# Patient Record
Sex: Male | Born: 1975 | Race: White | Hispanic: No | Marital: Single | Smoking: Current every day smoker
Health system: Southern US, Community
[De-identification: ages and names within clinical notes are randomized; demographics above are authoritative.]

## PROBLEM LIST (undated history)

## (undated) DIAGNOSIS — F17200 Nicotine dependence, unspecified, uncomplicated: Secondary | ICD-10-CM

## (undated) DIAGNOSIS — F419 Anxiety disorder, unspecified: Secondary | ICD-10-CM

---

## 2009-10-11 HISTORY — PX: CORONARY ANGIOPLASTY WITH STENT PLACEMENT: SHX49

## 2010-10-25 ENCOUNTER — Emergency Department (HOSPITAL_BASED_OUTPATIENT_CLINIC_OR_DEPARTMENT_OTHER)
Admission: EM | Admit: 2010-10-25 | Discharge: 2010-10-25 | Payer: Self-pay | Source: Home / Self Care | Admitting: Emergency Medicine

## 2010-10-26 LAB — POCT CARDIAC MARKERS
CKMB, poc: 1.3 ng/mL (ref 1.0–8.0)
CKMB, poc: <1 ng/mL — ABNORMAL LOW (ref 1.0–8.0)
Myoglobin, poc: 65.7 ng/mL (ref 12–200)
Myoglobin, poc: 55.8 ng/mL (ref 12–200)
Troponin i, poc: <0.05 ng/mL (ref 0.00–0.09)
Troponin i, poc: <0.05 ng/mL (ref 0.00–0.09)

## 2010-10-26 LAB — DIFFERENTIAL
Basophils Absolute: 0 10*3/uL (ref 0.0–0.1)
Basophils Relative: 0 % (ref 0–1)
Eosinophils Absolute: 0.2 10*3/uL (ref 0.0–0.7)
Eosinophils Relative: 2 % (ref 0–5)
Lymphocytes Relative: 16 % (ref 12–46)
Lymphs Abs: 2 10*3/uL (ref 0.7–4.0)
Monocytes Absolute: 0.8 10*3/uL (ref 0.1–1.0)
Monocytes Relative: 6 % (ref 3–12)
Neutro Abs: 9.4 10*3/uL — ABNORMAL HIGH (ref 1.7–7.7)
Neutrophils Relative %: 76 % (ref 43–77)

## 2010-10-26 LAB — COMPREHENSIVE METABOLIC PANEL
ALT: 45 U/L (ref 0–53)
AST: 66 U/L — ABNORMAL HIGH (ref 0–37)
Albumin: 4.4 g/dL (ref 3.5–5.2)
Alkaline Phosphatase: 86 U/L (ref 39–117)
BUN: 19 mg/dL (ref 6–23)
CO2: 21 mEq/L (ref 19–32)
Calcium: 9.3 mg/dL (ref 8.4–10.5)
Chloride: 109 mEq/L (ref 96–112)
Creatinine, Ser: 1 mg/dL (ref 0.4–1.5)
GFR calc Af Amer: >60 mL/min (ref >60)
GFR calc non Af Amer: >60 mL/min (ref >60)
Glucose, Bld: 91 mg/dL (ref 70–99)
Potassium: 4.2 mEq/L (ref 3.5–5.1)
Sodium: 144 mEq/L (ref 135–145)
Total Bilirubin: 1 mg/dL (ref 0.3–1.2)
Total Protein: 7.7 g/dL (ref 6.0–8.3)

## 2010-10-26 LAB — CBC
HCT: 49.2 % (ref 39.0–52.0)
Hemoglobin: 17.1 g/dL — ABNORMAL HIGH (ref 13.0–17.0)
MCH: 29.5 pg (ref 26.0–34.0)
MCHC: 34.8 g/dL (ref 30.0–36.0)
MCV: 85 fL (ref 78.0–100.0)
Platelets: 277 10*3/uL (ref 150–400)
RBC: 5.79 MIL/uL (ref 4.22–5.81)
RDW: 12.5 % (ref 11.5–15.5)
WBC: 12.4 10*3/uL — ABNORMAL HIGH (ref 4.0–10.5)

## 2010-10-26 LAB — URINALYSIS, ROUTINE W REFLEX MICROSCOPIC
Bilirubin Urine: NEGATIVE
Hgb urine dipstick: NEGATIVE
Ketones, ur: NEGATIVE mg/dL
Nitrite: NEGATIVE
Protein, ur: NEGATIVE mg/dL
Specific Gravity, Urine: 1.03 (ref 1.005–1.030)
Urine Glucose, Fasting: NEGATIVE mg/dL
Urobilinogen, UA: 0.2 mg/dL (ref 0.0–1.0)
pH: 8 (ref 5.0–8.0)

## 2020-08-21 DIAGNOSIS — Z955 Presence of coronary angioplasty implant and graft: Secondary | ICD-10-CM

## 2020-08-21 HISTORY — DX: Presence of coronary angioplasty implant and graft: Z95.5

## 2020-08-21 HISTORY — PX: CORONARY ANGIOPLASTY WITH STENT PLACEMENT: SHX49

## 2020-08-27 ENCOUNTER — Encounter (HOSPITAL_COMMUNITY)
Admission: EM | Discharge status: Against Medical Advice | Payer: Self-pay | Source: Home / Self Care | Attending: Cardiovascular Disease

## 2020-08-27 ENCOUNTER — Emergency Department (HOSPITAL_COMMUNITY): Payer: Self-pay

## 2020-08-27 ENCOUNTER — Inpatient Hospital Stay (HOSPITAL_COMMUNITY)
Admission: EM | Admit: 2020-08-27 | Discharge: 2020-08-28 | DRG: 280 | Discharge status: Against Medical Advice | Payer: Self-pay | Attending: Interventional Cardiology | Admitting: Interventional Cardiology

## 2020-08-27 DIAGNOSIS — Z79899 Other long term (current) drug therapy: Secondary | ICD-10-CM

## 2020-08-27 DIAGNOSIS — I21A9 Other myocardial infarction type: Secondary | ICD-10-CM | POA: Diagnosis present

## 2020-08-27 DIAGNOSIS — I472 Ventricular tachycardia: Secondary | ICD-10-CM | POA: Diagnosis present

## 2020-08-27 DIAGNOSIS — Y831 Surgical operation with implant of artificial internal device as the cause of abnormal reaction of the patient, or of later complication, without mention of misadventure at the time of the procedure: Secondary | ICD-10-CM | POA: Diagnosis present

## 2020-08-27 DIAGNOSIS — I110 Hypertensive heart disease with heart failure: Secondary | ICD-10-CM | POA: Diagnosis present

## 2020-08-27 DIAGNOSIS — I213 ST elevation (STEMI) myocardial infarction of unspecified site: Secondary | ICD-10-CM

## 2020-08-27 DIAGNOSIS — I4901 Ventricular fibrillation: Secondary | ICD-10-CM | POA: Diagnosis present

## 2020-08-27 DIAGNOSIS — I2510 Atherosclerotic heart disease of native coronary artery without angina pectoris: Secondary | ICD-10-CM

## 2020-08-27 DIAGNOSIS — Z955 Presence of coronary angioplasty implant and graft: Secondary | ICD-10-CM

## 2020-08-27 DIAGNOSIS — Y712 Prosthetic and other implants, materials and accessory cardiovascular devices associated with adverse incidents: Secondary | ICD-10-CM | POA: Diagnosis present

## 2020-08-27 DIAGNOSIS — I97190 Other postprocedural cardiac functional disturbances following cardiac surgery: Secondary | ICD-10-CM | POA: Diagnosis present

## 2020-08-27 DIAGNOSIS — M7989 Other specified soft tissue disorders: Secondary | ICD-10-CM | POA: Diagnosis not present

## 2020-08-27 DIAGNOSIS — T82867A Thrombosis of cardiac prosthetic devices, implants and grafts, initial encounter: Principal | ICD-10-CM | POA: Diagnosis present

## 2020-08-27 DIAGNOSIS — I5023 Acute on chronic systolic (congestive) heart failure: Secondary | ICD-10-CM | POA: Diagnosis present

## 2020-08-27 DIAGNOSIS — Z20822 Contact with and (suspected) exposure to covid-19: Secondary | ICD-10-CM | POA: Diagnosis present

## 2020-08-27 DIAGNOSIS — I2102 ST elevation (STEMI) myocardial infarction involving left anterior descending coronary artery: Secondary | ICD-10-CM

## 2020-08-27 DIAGNOSIS — I9752 Accidental puncture and laceration of a circulatory system organ or structure during other procedure: Secondary | ICD-10-CM | POA: Diagnosis not present

## 2020-08-27 DIAGNOSIS — R079 Chest pain, unspecified: Secondary | ICD-10-CM

## 2020-08-27 DIAGNOSIS — Y838 Other surgical procedures as the cause of abnormal reaction of the patient, or of later complication, without mention of misadventure at the time of the procedure: Secondary | ICD-10-CM | POA: Diagnosis not present

## 2020-08-27 DIAGNOSIS — Z5329 Procedure and treatment not carried out because of patient's decision for other reasons: Secondary | ICD-10-CM | POA: Diagnosis present

## 2020-08-27 HISTORY — DX: Anxiety disorder, unspecified: F41.9

## 2020-08-27 HISTORY — PX: LEFT HEART CATH AND CORONARY ANGIOGRAPHY: CATH118249

## 2020-08-27 HISTORY — DX: Nicotine dependence, unspecified, uncomplicated: F17.200

## 2020-08-27 LAB — HEMOGLOBIN A1C
Hgb A1c MFr Bld: 5.5 % (ref 4.8–5.6)
Mean Plasma Glucose: 111.15 mg/dL

## 2020-08-27 LAB — COMPREHENSIVE METABOLIC PANEL
ALT: 147 U/L — ABNORMAL HIGH (ref 0–44)
AST: 174 U/L — ABNORMAL HIGH (ref 15–41)
Albumin: 3.5 g/dL (ref 3.5–5.0)
Alkaline Phosphatase: 59 U/L (ref 38–126)
Anion gap: 11 (ref 5–15)
BUN: 37 mg/dL — ABNORMAL HIGH (ref 6–20)
CO2: 23 mmol/L (ref 22–32)
Calcium: 9.5 mg/dL (ref 8.9–10.3)
Chloride: 102 mmol/L (ref 98–111)
Creatinine, Ser: 1.03 mg/dL (ref 0.61–1.24)
GFR, Estimated: >60 mL/min (ref >60)
Glucose, Bld: 98 mg/dL (ref 70–99)
Potassium: 4 mmol/L (ref 3.5–5.1)
Sodium: 136 mmol/L (ref 135–145)
Total Bilirubin: 0.8 mg/dL (ref 0.3–1.2)
Total Protein: 6.6 g/dL (ref 6.5–8.1)

## 2020-08-27 LAB — CBC WITH DIFFERENTIAL/PLATELET
Abs Immature Granulocytes: 0.06 10*3/uL (ref 0.00–0.07)
Basophils Absolute: 0 10*3/uL (ref 0.0–0.1)
Basophils Relative: 0 %
Eosinophils Absolute: 0.3 10*3/uL (ref 0.0–0.5)
Eosinophils Relative: 2 %
HCT: 45.9 % (ref 39.0–52.0)
Hemoglobin: 15.6 g/dL (ref 13.0–17.0)
Immature Granulocytes: 1 %
Lymphocytes Relative: 26 %
Lymphs Abs: 3.3 10*3/uL (ref 0.7–4.0)
MCH: 30.9 pg (ref 26.0–34.0)
MCHC: 34 g/dL (ref 30.0–36.0)
MCV: 90.9 fL (ref 80.0–100.0)
Monocytes Absolute: 1.1 10*3/uL — ABNORMAL HIGH (ref 0.1–1.0)
Monocytes Relative: 8 %
Neutro Abs: 8.1 10*3/uL — ABNORMAL HIGH (ref 1.7–7.7)
Neutrophils Relative %: 63 %
Platelets: 375 10*3/uL (ref 150–400)
RBC: 5.05 MIL/uL (ref 4.22–5.81)
RDW: 13.2 % (ref 11.5–15.5)
WBC: 12.8 10*3/uL — ABNORMAL HIGH (ref 4.0–10.5)
nRBC: 0 % (ref 0.0–0.2)

## 2020-08-27 LAB — APTT: aPTT: 29 seconds (ref 24–36)

## 2020-08-27 LAB — PROTIME-INR
INR: 1 (ref 0.8–1.2)
Prothrombin Time: 12.3 seconds (ref 11.4–15.2)

## 2020-08-27 LAB — LIPID PANEL
Cholesterol: 188 mg/dL (ref 0–200)
HDL: 44 mg/dL (ref >40)
LDL Cholesterol: 111 mg/dL — ABNORMAL HIGH (ref 0–99)
Total CHOL/HDL Ratio: 4.3 RATIO
Triglycerides: 164 mg/dL — ABNORMAL HIGH (ref <150)
VLDL: 33 mg/dL (ref 0–40)

## 2020-08-27 LAB — TROPONIN I (HIGH SENSITIVITY): Troponin I (High Sensitivity): 845 ng/L (ref <18)

## 2020-08-27 LAB — RESPIRATORY PANEL BY RT PCR (FLU A&B, COVID)
Influenza A by PCR: NEGATIVE
Influenza B by PCR: NEGATIVE
SARS Coronavirus 2 by RT PCR: NEGATIVE

## 2020-08-27 SURGERY — LEFT HEART CATH AND CORONARY ANGIOGRAPHY
Anesthesia: LOCAL

## 2020-08-27 MED ORDER — TICAGRELOR 90 MG PO TABS
90.0000 mg | ORAL_TABLET | Freq: Two times a day (BID) | ORAL | Status: DC
  Administered 2020-08-28: 90 mg via ORAL
  Filled 2020-08-27: qty 1

## 2020-08-27 MED ORDER — SODIUM CHLORIDE 0.9% FLUSH
3.0000 mL | Freq: Two times a day (BID) | INTRAVENOUS | Status: DC
  Administered 2020-08-28: 3 mL via INTRAVENOUS

## 2020-08-27 MED ORDER — IOHEXOL 350 MG/ML SOLN
INTRAVENOUS | Status: AC
  Filled 2020-08-27: qty 1

## 2020-08-27 MED ORDER — ONDANSETRON HCL 4 MG/2ML IJ SOLN: 4.0000 mg | Freq: Four times a day (QID) | INTRAMUSCULAR | Status: DC | PRN

## 2020-08-27 MED ORDER — HEPARIN (PORCINE) 25000 UT/250ML-% IV SOLN: 1000.0000 [IU]/h | INTRAVENOUS | Status: DC

## 2020-08-27 MED ORDER — IOHEXOL 350 MG/ML SOLN
INTRAVENOUS | Status: DC | PRN
  Administered 2020-08-27: 70 mL via INTRA_ARTERIAL

## 2020-08-27 MED ORDER — VERAPAMIL HCL 2.5 MG/ML IV SOLN
INTRAVENOUS | Status: AC
  Filled 2020-08-27: qty 2

## 2020-08-27 MED ORDER — ACETAMINOPHEN 325 MG PO TABS: 650.0000 mg | ORAL_TABLET | ORAL | Status: DC | PRN

## 2020-08-27 MED ORDER — MIDAZOLAM HCL 2 MG/2ML IJ SOLN
INTRAMUSCULAR | Status: AC
  Filled 2020-08-27: qty 2

## 2020-08-27 MED ORDER — HYDRALAZINE HCL 20 MG/ML IJ SOLN: 10.0000 mg | INTRAMUSCULAR | Status: AC | PRN

## 2020-08-27 MED ORDER — SODIUM CHLORIDE 0.9 % IV SOLN: INTRAVENOUS | Status: DC

## 2020-08-27 MED ORDER — TIROFIBAN HCL IN NACL 5-0.9 MG/100ML-% IV SOLN
INTRAVENOUS | Status: AC | PRN
  Administered 2020-08-27: 0.15 ug/kg/min via INTRAVENOUS

## 2020-08-27 MED ORDER — HEPARIN SODIUM (PORCINE) 5000 UNIT/ML IJ SOLN
4000.0000 [IU] | Freq: Once | INTRAMUSCULAR | Status: AC
  Administered 2020-08-27: 4000 [IU] via INTRAVENOUS

## 2020-08-27 MED ORDER — MIDAZOLAM HCL 2 MG/2ML IJ SOLN
INTRAMUSCULAR | Status: DC | PRN
  Administered 2020-08-27: 2 mg via INTRAVENOUS

## 2020-08-27 MED ORDER — NITROGLYCERIN IN D5W 200-5 MCG/ML-% IV SOLN
0.0000 ug/min | INTRAVENOUS | Status: DC
  Administered 2020-08-27: 5 ug/min via INTRAVENOUS

## 2020-08-27 MED ORDER — LIDOCAINE HCL (PF) 1 % IJ SOLN
INTRAMUSCULAR | Status: AC
  Filled 2020-08-27: qty 30

## 2020-08-27 MED ORDER — TICAGRELOR 90 MG PO TABS
ORAL_TABLET | ORAL | Status: DC | PRN
  Administered 2020-08-27: 180 mg via ORAL

## 2020-08-27 MED ORDER — DIAZEPAM 5 MG PO TABS: 5.0000 mg | ORAL_TABLET | Freq: Four times a day (QID) | ORAL | Status: DC | PRN

## 2020-08-27 MED ORDER — TIROFIBAN (AGGRASTAT) BOLUS VIA INFUSION
INTRAVENOUS | Status: DC | PRN
  Administered 2020-08-27: 2155 ug via INTRAVENOUS

## 2020-08-27 MED ORDER — TICAGRELOR 90 MG PO TABS
ORAL_TABLET | ORAL | Status: AC
  Filled 2020-08-27: qty 2

## 2020-08-27 MED ORDER — FENTANYL CITRATE (PF) 100 MCG/2ML IJ SOLN
INTRAMUSCULAR | Status: DC | PRN
  Administered 2020-08-27: 25 ug via INTRAVENOUS

## 2020-08-27 MED ORDER — SODIUM CHLORIDE 0.9 % IV SOLN: 250.0000 mL | INTRAVENOUS | Status: DC | PRN

## 2020-08-27 MED ORDER — VERAPAMIL HCL 2.5 MG/ML IV SOLN
INTRAVENOUS | Status: DC | PRN
  Administered 2020-08-27: 10 mL via INTRA_ARTERIAL

## 2020-08-27 MED ORDER — ASPIRIN 81 MG PO CHEW
81.0000 mg | CHEWABLE_TABLET | Freq: Every day | ORAL | Status: DC
  Administered 2020-08-28: 81 mg via ORAL
  Filled 2020-08-27: qty 1

## 2020-08-27 MED ORDER — ATORVASTATIN CALCIUM 80 MG PO TABS
80.0000 mg | ORAL_TABLET | Freq: Every day | ORAL | Status: DC
  Administered 2020-08-28: 80 mg via ORAL
  Filled 2020-08-27: qty 1

## 2020-08-27 MED ORDER — LIDOCAINE HCL (PF) 1 % IJ SOLN
INTRAMUSCULAR | Status: DC | PRN
  Administered 2020-08-27: 5 mL via SUBCUTANEOUS

## 2020-08-27 MED ORDER — FENTANYL CITRATE (PF) 100 MCG/2ML IJ SOLN
INTRAMUSCULAR | Status: AC
  Filled 2020-08-27: qty 2

## 2020-08-27 MED ORDER — NITROGLYCERIN IN D5W 200-5 MCG/ML-% IV SOLN
0.0000 ug/min | INTRAVENOUS | Status: DC
  Administered 2020-08-27: 15 ug/min via INTRAVENOUS

## 2020-08-27 MED ORDER — TIROFIBAN HCL IN NACL 5-0.9 MG/100ML-% IV SOLN
INTRAVENOUS | Status: AC
  Filled 2020-08-27: qty 100

## 2020-08-27 MED ORDER — HEPARIN (PORCINE) IN NACL 1000-0.9 UT/500ML-% IV SOLN
INTRAVENOUS | Status: DC | PRN
  Administered 2020-08-27 (×2): 500 mL

## 2020-08-27 MED ORDER — HEPARIN (PORCINE) IN NACL 1000-0.9 UT/500ML-% IV SOLN
INTRAVENOUS | Status: AC
  Filled 2020-08-27: qty 1000

## 2020-08-27 MED ORDER — HEPARIN SODIUM (PORCINE) 1000 UNIT/ML IJ SOLN
INTRAMUSCULAR | Status: DC | PRN
  Administered 2020-08-27: 3000 [IU] via INTRAVENOUS
  Administered 2020-08-27: 4000 [IU] via INTRAVENOUS

## 2020-08-27 MED ORDER — HEPARIN SODIUM (PORCINE) 1000 UNIT/ML IJ SOLN
INTRAMUSCULAR | Status: AC
  Filled 2020-08-27: qty 1

## 2020-08-27 MED ORDER — SODIUM CHLORIDE 0.9 % IV SOLN: INTRAVENOUS | Status: AC

## 2020-08-27 MED ORDER — SODIUM CHLORIDE 0.9% FLUSH: 3.0000 mL | INTRAVENOUS | Status: DC | PRN

## 2020-08-27 MED ORDER — LABETALOL HCL 5 MG/ML IV SOLN: 10.0000 mg | INTRAVENOUS | Status: AC | PRN

## 2020-08-27 MED ORDER — NITROGLYCERIN 1 MG/10 ML FOR IR/CATH LAB
INTRA_ARTERIAL | Status: AC
  Filled 2020-08-27: qty 10

## 2020-08-27 MED ORDER — TIROFIBAN HCL IN NACL 5-0.9 MG/100ML-% IV SOLN
0.0750 ug/kg/min | INTRAVENOUS | Status: DC
  Administered 2020-08-27: 0.15 ug/kg/min via INTRAVENOUS

## 2020-08-27 SURGICAL SUPPLY — 12 items
CATH INFINITI JR4 5F (CATHETERS) ×2 IMPLANT
CATH VISTA GUIDE 6FR XBLAD3.5 (CATHETERS) ×2 IMPLANT
DEVICE RAD COMP TR BAND LRG (VASCULAR PRODUCTS) ×2 IMPLANT
GLIDESHEATH SLEND SS 6F .021 (SHEATH) ×2 IMPLANT
GUIDEWIRE INQWIRE 1.5J.035X260 (WIRE) ×1 IMPLANT
INQWIRE 1.5J .035X260CM (WIRE) ×2
KIT ENCORE 26 ADVANTAGE (KITS) ×2 IMPLANT
KIT HEART LEFT (KITS) ×2 IMPLANT
PACK CARDIAC CATHETERIZATION (CUSTOM PROCEDURE TRAY) ×2 IMPLANT
SYR MEDRAD MARK 7 150ML (SYRINGE) ×2 IMPLANT
TRANSDUCER W/STOPCOCK (MISCELLANEOUS) ×2 IMPLANT
TUBING CIL FLEX 10 FLL-RA (TUBING) ×2 IMPLANT

## 2020-08-27 NOTE — ED Provider Notes (Signed)
[facility] EMERGENCY DEPARTMENT Provider Note   CSN: [id number] Arrival date & time: 08/27/20  2115     History Chief Complaint  Patient presents with  . Chest Pain    Francisco Stewart is a 44 y.o. male.  The history is provided by the patient, medical records and the EMS personnel.   Francisco Stewart is a 44 y.o. male who presents to the Emergency Department complaining of chest pain.  He presents to the ED by EMS complaining of chest pain that started about two hours prior to ED arrival.  He has a hx/o HTN, CAD s/o stenting at [facility] last week.  He was unable to fill his brillinta at the time of hospital discharge.  He has experienced mild intermittent chest pain since hospital discharge.  Today the pain significantly worsened and EMS was called.  EMS activated a STEMI prior to ED arrival.  He took 325 aspirin at home.  He received two SL NTG by EMS with improvement in his pain to 8/10 from 10/10.  Pain is located in central chest and radiates to his right jaw and bilateral shoulders.  Denies additional sxs.  He is a smoker.  No alcohol or drug use.      No past medical history on file. CAD Patient Active Problem List   Diagnosis Date Noted  . STEMI involving left anterior descending coronary artery (HCC) 08/27/2020  . ST elevation myocardial infarction involving left anterior descending (LAD) coronary artery (HCC)          No family history on file.  Social History   Tobacco Use  . Smoking status: Not on file  Substance Use Topics  . Alcohol use: Not on file  . Drug use: Not on file    Home Medications Prior to Admission medications   Not on File    Allergies    Patient has no known allergies.  Review of Systems   Review of Systems  All other systems reviewed and are negative.   Physical Exam Updated Vital Signs BP 110/77   Pulse (!) 55   Temp 98 F (36.7 C) (Oral)   Resp 17   Ht 6\' 2"  (1.88 m)   Wt 86.2 kg   SpO2 99%   BMI 24.39  kg/m   Physical Exam Vitals and nursing note reviewed.  Constitutional:      General: He is in acute distress.     Appearance: He is well-developed. He is ill-appearing.  HENT:     Head: Normocephalic and atraumatic.  Cardiovascular:     Rate and Rhythm: Normal rate and regular rhythm.     Heart sounds: No murmur heard.   Pulmonary:     Effort: Pulmonary effort is normal. No respiratory distress.     Breath sounds: Normal breath sounds.  Abdominal:     Palpations: Abdomen is soft.     Tenderness: There is no abdominal tenderness. There is no guarding or rebound.  Musculoskeletal:        General: No swelling or tenderness.  Skin:    General: Skin is warm and dry.  Neurological:     Mental Status: He is alert and oriented to person, place, and time.  Psychiatric:        Behavior: Behavior normal.     ED Results / Procedures / Treatments   Labs (all labs ordered are listed, but only abnormal results are displayed) Labs Reviewed  CBC WITH DIFFERENTIAL/PLATELET - Abnormal; Notable for the following  components:      Result Value   WBC 12.8 (*)    Neutro Abs 8.1 (*)    Monocytes Absolute 1.1 (*)    All other components within normal limits  COMPREHENSIVE METABOLIC PANEL - Abnormal; Notable for the following components:   BUN 37 (*)    AST 174 (*)    ALT 147 (*)    All other components within normal limits  LIPID PANEL - Abnormal; Notable for the following components:   Triglycerides 164 (*)    LDL Cholesterol 111 (*)    All other components within normal limits  TROPONIN I (HIGH SENSITIVITY) - Abnormal; Notable for the following components:   Troponin I (High Sensitivity) 845 (*)    All other components within normal limits  RESPIRATORY PANEL BY RT PCR (FLU A&B, COVID)  MRSA PCR SCREENING  HEMOGLOBIN A1C  PROTIME-INR  APTT  CBC  BASIC METABOLIC PANEL  HEPARIN LEVEL (UNFRACTIONATED)  TROPONIN I (HIGH SENSITIVITY)    EKG None  Radiology CARDIAC  CATHETERIZATION  Result Date: 08/27/2020  Prox LAD to Mid LAD lesion is 25% stenosed.  1st Diag lesion is 50% stenosed.  Patent LAD stent with mild residual thrombus in the distal portion of the stent with brisk TIMI 3 flow.  The initial ECG by EMS showed acute anterior ST elevation suggesting initial total occlusion secondary to thrombus which reperfused in the ER with heparin and NTG resulting in evolutionary T wave inversion prior to coming to the cath lab. The patient had not taken brilinta since his procedure on 08/21/20 resulting in acute stent thrombosis. Normal ramus, left circumflex and large dominant RCA. LVEDP 10 mm Hg. RECOMMENDATION: Continue aggrastat infusion until re-look cath on 08/29/20. Will resume heparin 8 hours post procedure. DAPT with ASA/brilint for at least 12 months. Aggressive lipid lowering with target LDL <70. Once IV NTG is dc'd initiate oral therapy with diagonal disease.  2D-echo in am. Consider post MI ACE-I/ARB and beta blocker as BP and HR allow.   DG Chest Port 1 View  Result Date: 08/27/2020 CLINICAL DATA:  Chest pain EXAM: PORTABLE CHEST 1 VIEW COMPARISON:  None.  August 21, 2020 FINDINGS: The heart size and mediastinal contours are within normal limits. There is a small left and trace right pleural effusion present. The visualized skeletal structures are unremarkable. IMPRESSION: Small left and trace right pleural effusion present. Electronically Signed   By: Jonna Clark M.D.   On: 08/27/2020 21:36    Procedures Procedures (including critical care time) CRITICAL CARE Performed by: Tilden Fossa   Total critical care time: 35 minutes  Critical care time was exclusive of separately billable procedures and treating other patients.  Critical care was necessary to treat or prevent imminent or life-threatening deterioration.  Critical care was time spent personally by me on the following activities: development of treatment plan with patient and/or  surrogate as well as nursing, discussions with consultants, evaluation of patient's response to treatment, examination of patient, obtaining history from patient or surrogate, ordering and performing treatments and interventions, ordering and review of laboratory studies, ordering and review of radiographic studies, pulse oximetry and re-evaluation of patient's condition.  Medications Ordered in ED Medications  0.9 %  sodium chloride infusion ( Intravenous New Bag/Given 08/27/20 2229)  labetalol (NORMODYNE) injection 10 mg (has no administration in time range)  hydrALAZINE (APRESOLINE) injection 10 mg (has no administration in time range)  acetaminophen (TYLENOL) tablet 650 mg (has no administration in time range)  ondansetron (  ZOFRAN) injection 4 mg (has no administration in time range)  sodium chloride flush (NS) 0.9 % injection 3 mL (has no administration in time range)  sodium chloride flush (NS) 0.9 % injection 3 mL (has no administration in time range)  0.9 %  sodium chloride infusion (has no administration in time range)  diazepam (VALIUM) tablet 5 mg (has no administration in time range)  atorvastatin (LIPITOR) tablet 80 mg (has no administration in time range)  aspirin chewable tablet 81 mg (has no administration in time range)  ticagrelor (BRILINTA) tablet 90 mg (has no administration in time range)  nitroGLYCERIN 50 mg in dextrose 5 % 250 mL (0.2 mg/mL) infusion (15 mcg/min Intravenous Rate/Dose Verify 08/28/20 0000)  0.9 %  sodium chloride infusion ( Intravenous Rate/Dose Verify 08/28/20 0000)    Followed by  0.9 %  sodium chloride infusion (has no administration in time range)  heparin ADULT infusion 100 units/mL (25000 units/284mL sodium chloride 0.45%) (has no administration in time range)  tirofiban (AGGRASTAT) infusion 50 mcg/mL 100 mL (0.15 mcg/kg/min  86.2 kg Intravenous Rate/Dose Verify 08/28/20 0000)  heparin injection 4,000 Units (4,000 Units Intravenous Given 08/27/20  2118)  tirofiban (AGGRASTAT) infusion 50 mcg/mL 100 mL (0.15 mcg/kg/min  86.2 kg Intravenous New Bag/Given 08/27/20 2228)    ED Course  I have reviewed the triage vital signs and the nursing notes.  Pertinent labs & imaging results that were available during my care of the patient were reviewed by me and considered in my medical decision making (see chart for details).    MDM Rules/Calculators/A&P                         Pt here as a STEMI by EMS.  Pt with recent STEMI at [facility] s/p stent placement.  He has been unable to take his Brillinta since leaving the hospital.  He was treated with heparin bolus and started on a nitroglycerin drip.  He was evaluated by Cardiology in the ED with plan to take directly to the cath lab for intervention.    Final Clinical Impression(s) / ED Diagnoses Final diagnoses:  ST elevation myocardial infarction (STEMI), unspecified artery [facility])    Rx / DC Orders ED Discharge Orders    None       Tilden Fossa, MD 08/28/20 0025

## 2020-08-27 NOTE — Progress Notes (Signed)
Chaplain responded to page to [facility]. Patient brought into Resus room and now heading for Cath lab.  Patient reportedly contacted his spouse Raynelle Fanning in route to the hospital.  Lunette Stands will be available for support if spouse comes. Will refer to unit chaplain in the AM. Rev. Lynnell Chad   Pager [phone]

## 2020-08-27 NOTE — ED Notes (Signed)
IV push heparin 4000 units per pharmacy by andrew RN.

## 2020-08-27 NOTE — ED Triage Notes (Signed)
Pt arrived via [facility] from home after chest pain with left arm pain. Pt had stent last week at [facility].

## 2020-08-27 NOTE — H&P (Signed)
Cardiology Admission History and Physical:   Patient ID: Francisco Stewart MRN: [id number]; DOB: [birth year]   Admission date: 08/27/2020  Primary Care Provider: No primary care provider on file. [facility] Cardiologist: No primary care provider on file.  [facility] Electrophysiologist:  None   Chief Complaint:  STEMI  Patient Profile:   Francisco Stewart is a 44 y.o. male with h/o CAD, s/p anterior STEMI last week on 08-21-20, now presenting after not taking DAPT since PCI w/ recurrent sx and EKG changes suggestive of possible acute stent thrombosis  History of Present Illness:   Mr. Francisco Stewart was admitted at [facility] last week after presenting w/ anterior STEMI. Notes from [facility] indicate that pt had h/o CAD w/ h/o PCI to LAD in the remote past. Before pt could be taken to the cath lab, he had VF arrest x 3 requiring multiple DCCV. He was intubated, taken to cath lab, and underwent PCI to LAD.  On-call cardiologist was notified later in the evening after the initial PCI that pt had recurrent CP w/ residual ST elevation; he was taken back to the cath lab and no thrombosis or residual significant lesion was noted. Documentation from this point is sparse; there is no discharge summary but pt states the he "just decided to leave" and had to be escorted out of the hospital w/ security, making it sound like he may have left AMA. He did not have his brilinta or any prescriptions and therefore has not been on DAPT since his initial PCI on 08-21-20.  He has been having intermittent chest discomfort since that time but it became acutely more severe, like his STEMI, earlier this evening so he called EMS. He was found to have recurrent ST elevations in the anterior leads.   No past medical history on file.  CAD w/ h/o PCI to LAD last week at [facility]     Medications Prior to Admission: Prior to Admission medications   Not on File   pt has not been taking any home meds  Allergies:   No Known  Allergies  Social History:  +current smoker   Family History:   The patient's family history is not on file.   FHx non-contributory  ROS:  Please see the history of present illness.  All other ROS reviewed and negative.     Physical Exam/Data:   Vitals:   08/27/20 2125 08/27/20 2130 08/27/20 2135 08/27/20 2150  BP: 130/85 (!) 129/91 125/75   Pulse: 68 63 63   Resp: 15 17 14    Temp:      TempSrc:      SpO2: 100% 99% 99% 97%  Weight:      Height:       No intake or output data in the 24 hours ending 08/27/20 2158 Last 3 Weights 08/27/2020  Weight (lbs) 190 lb  Weight (kg) 86.183 kg     Body mass index is 24.39 kg/m.  General:  Well nourished, well developed, in no acute distress HEENT: normal Lymph: no adenopathy Neck: no JVD Endocrine:  No thryomegaly Vascular: No carotid bruits; DP pulses 2+ bilaterally   Cardiac:  normal S1, S2; RRR; no murmur  Lungs:  clear to auscultation bilaterally, no wheezing, rhonchi or rales  Abd: soft, nontender, no hepatomegaly  Ext: no edema Musculoskeletal:  No deformities, BUE and BLE strength normal and equal Skin: warm and dry  Neuro:  CNs 2-12 intact, no focal abnormalities noted Psych:  Normal affect    EKG:  The ECG that was done 08-27-20 was personally reviewed and demonstrates NSR with evolving anterior STEMI  Relevant CV Studies: 08-21-20 initial LHC/RHC PLAD thrombotic occlusion; TIMI =0, instent LM, circ, RCA wnl  PCI: DES LAD 3.5x20 synergy, posted 3.5@18atm .  IVUS: Good stent apposition, moderate distal Stent edge plaque, no flap.   RHC: normal pressures. LVEDP and PCWP 16-18 range. TD CO 3.2  Echo in lab: EF 25-30%   Stat TTE in the cath lab 08-21-20 SUMMARY  Limited stat echo in the cath lab.  There is no pericardial effusion.  LV ejection fraction = 30-35%.   Laboratory Data:  High Sensitivity Troponin:  No results for input(s): TROPONINIHS in the last 720 hours.    ChemistryNo results for input(s):  NA, K, CL, CO2, GLUCOSE, BUN, CREATININE, CALCIUM, GFRNONAA, GFRAA, ANIONGAP in the last 168 hours.  No results for input(s): PROT, ALBUMIN, AST, ALT, ALKPHOS, BILITOT in the last 168 hours. HematologyNo results for input(s): WBC, RBC, HGB, HCT, MCV, MCH, MCHC, RDW, PLT in the last 168 hours. BNPNo results for input(s): BNP, PROBNP in the last 168 hours.  DDimer No results for input(s): DDIMER in the last 168 hours.   Radiology/Studies:  DG Chest Port 1 View  Result Date: 08/27/2020 CLINICAL DATA:  Chest pain EXAM: PORTABLE CHEST 1 VIEW COMPARISON:  None.  August 21, 2020 FINDINGS: The heart size and mediastinal contours are within normal limits. There is a small left and trace right pleural effusion present. The visualized skeletal structures are unremarkable. IMPRESSION: Small left and trace right pleural effusion present. Electronically Signed   By: Jonna Clark M.D.   On: 08/27/2020 21:36     Assessment and Plan:   1. CP/STEMI: he has been off DAPT since initial PCI to LAD last week; concern for acute stent thrombosis. Plan for emergent LHC for further evaluation Will start statin, BB, and risk stratify w/ TSH, HgbA1c, and FLP. Initiate DAPT.      TIMI Risk Score for ST  Elevation MI:   The patient's TIMI risk score is 3, which indicates a 4.4% risk of all cause mortality at 30 days.     For questions or updates, please contact [facility] Please consult [email or web address] for contact info under     Signed, Precious Reel, MD, [facility] 08/27/2020 9:58 PM

## 2020-08-27 NOTE — Progress Notes (Signed)
ANTICOAGULATION CONSULT NOTE - Initial Consult  Pharmacy Consult for heparin  Indication: chest pain/ACS  No Known Allergies  Patient Measurements: Height: 6\' 2"  (188 cm) Weight: 86.2 kg (190 lb) IBW/kg (Calculated) : 82.2 HEPARIN DW (KG): 86.2   Vital Signs: Temp: 97.3 F (36.3 C) (11/17 2117) Temp Source: Oral (11/17 2117) BP: 125/75 (11/17 2135) Pulse Rate: 63 (11/17 2135)  Labs: Recent Labs    08/27/20 2118  HGB 15.6  HCT 45.9  PLT 375  APTT 29  LABPROT 12.3  INR 1.0  CREATININE 1.03  TROPONINIHS 845*    Estimated Creatinine Clearance: 104.2 mL/min (by C-G formula based on SCr of 1.03 mg/dL).   Medical History: No past medical history on file.   Assessment: 44 yo male here with CODE STEMI and s/p cath.  He had a STEMI 08/21/20 with PCI but has not been taking any medications.  Pharmacy to dose heparin (start 8 hours post sheath). He will also continue tirofiban until his PCI -Hg= 15.6  Goal of Therapy:  Heparin level 0.3-0.5 units/ml Monitor platelets by anticoagulation protocol: Yes   Plan:  -Start heparin at 1000 units/hr 8 hours after sheath removal -Heparin level in 6 hours and daily wth CBC daily -Continue tirofiban at 0.28mcg/kg/min -Heparin level and CBC in 8 hours and daily wth CBC daily  12m, PharmD Clinical Pharmacist **Pharmacist phone directory can now be found on [email or web address] ([email or web address]).  Listed under [facility].

## 2020-08-27 NOTE — ED Notes (Signed)
Transported to cath lab 

## 2020-08-28 ENCOUNTER — Inpatient Hospital Stay (HOSPITAL_COMMUNITY): Payer: Self-pay

## 2020-08-28 ENCOUNTER — Encounter (HOSPITAL_COMMUNITY): Payer: Self-pay | Admitting: Cardiovascular Disease

## 2020-08-28 ENCOUNTER — Other Ambulatory Visit: Payer: Self-pay

## 2020-08-28 DIAGNOSIS — T82867A Thrombosis of cardiac prosthetic devices, implants and grafts, initial encounter: Principal | ICD-10-CM

## 2020-08-28 DIAGNOSIS — I2102 ST elevation (STEMI) myocardial infarction involving left anterior descending coronary artery: Secondary | ICD-10-CM

## 2020-08-28 DIAGNOSIS — I5023 Acute on chronic systolic (congestive) heart failure: Secondary | ICD-10-CM

## 2020-08-28 LAB — ECHOCARDIOGRAM COMPLETE
Area-P 1/2: 3.48 cm2
Height: 74 in
S' Lateral: 3.8 cm
Weight: 3051.17 oz

## 2020-08-28 LAB — POCT I-STAT, CHEM 8
BUN: 37 mg/dL — ABNORMAL HIGH (ref 6–20)
Calcium, Ion: 1.29 mmol/L (ref 1.15–1.40)
Chloride: 100 mmol/L (ref 98–111)
Creatinine, Ser: 0.8 mg/dL (ref 0.61–1.24)
Glucose, Bld: 103 mg/dL — ABNORMAL HIGH (ref 70–99)
HCT: 40 % (ref 39.0–52.0)
Hemoglobin: 13.6 g/dL (ref 13.0–17.0)
Potassium: 3.4 mmol/L — ABNORMAL LOW (ref 3.5–5.1)
Sodium: 138 mmol/L (ref 135–145)
TCO2: 26 mmol/L (ref 22–32)

## 2020-08-28 LAB — CBC
HCT: 41.9 % (ref 39.0–52.0)
Hemoglobin: 14.6 g/dL (ref 13.0–17.0)
MCH: 31.1 pg (ref 26.0–34.0)
MCHC: 34.8 g/dL (ref 30.0–36.0)
MCV: 89.1 fL (ref 80.0–100.0)
Platelets: 349 10*3/uL (ref 150–400)
RBC: 4.7 MIL/uL (ref 4.22–5.81)
RDW: 13.1 % (ref 11.5–15.5)
WBC: 10.3 10*3/uL (ref 4.0–10.5)
nRBC: 0 % (ref 0.0–0.2)

## 2020-08-28 LAB — BASIC METABOLIC PANEL
Anion gap: 10 (ref 5–15)
BUN: 31 mg/dL — ABNORMAL HIGH (ref 6–20)
CO2: 21 mmol/L — ABNORMAL LOW (ref 22–32)
Calcium: 8.8 mg/dL — ABNORMAL LOW (ref 8.9–10.3)
Chloride: 106 mmol/L (ref 98–111)
Creatinine, Ser: 0.91 mg/dL (ref 0.61–1.24)
GFR, Estimated: >60 mL/min (ref >60)
Glucose, Bld: 92 mg/dL (ref 70–99)
Potassium: 3.9 mmol/L (ref 3.5–5.1)
Sodium: 137 mmol/L (ref 135–145)

## 2020-08-28 LAB — MRSA PCR SCREENING: MRSA by PCR: NEGATIVE

## 2020-08-28 LAB — POCT ACTIVATED CLOTTING TIME: Activated Clotting Time: 180 seconds

## 2020-08-28 LAB — TROPONIN I (HIGH SENSITIVITY): Troponin I (High Sensitivity): 616 ng/L (ref <18)

## 2020-08-28 MED ORDER — CHLORHEXIDINE GLUCONATE CLOTH 2 % EX PADS: 6.0000 | MEDICATED_PAD | Freq: Every day | CUTANEOUS | Status: DC

## 2020-08-28 NOTE — Progress Notes (Signed)
Walked into pt room due to telemetry alarming and IV pumps alarming. Upon entering the room the patient and his visitor were gone. The patient had unhooked himself from his IV medications as well as his telemetry, and took off his gown and left everything in the bed. Attending MD, Charge RN, and security notified of patient leaving room with visitor and unable to locate.

## 2020-08-28 NOTE — Progress Notes (Signed)
Progress Note  Patient Name: Francisco Stewart Date of Encounter: 08/28/2020  [facility] Cardiologist: No primary care provider on file.   Subjective   Feels great currently.  No chest pain.  Inpatient Medications    Scheduled Meds: . aspirin  81 mg Oral Daily  . atorvastatin  80 mg Oral Daily  . sodium chloride flush  3 mL Intravenous Q12H  . ticagrelor  90 mg Oral BID   Continuous Infusions: . sodium chloride 125 mL/hr at 08/27/20 2229  . sodium chloride    . sodium chloride 75 mL/hr at 08/28/20 0817  . nitroGLYCERIN 10 mcg/min (08/28/20 0817)   PRN Meds: sodium chloride, acetaminophen, diazepam, ondansetron (ZOFRAN) IV, sodium chloride flush   Vital Signs    Vitals:   08/28/20 0700 08/28/20 0740 08/28/20 0800 08/28/20 0821  BP:  123/85 119/88   Pulse: (!) 57 60 (!) 57   Resp: 19 16 (!) 23   Temp:   98.2 F (36.8 C) 98.6 F (37 C)  TempSrc:   Oral   SpO2: 99% 99% 97%   Weight:      Height:        Intake/Output Summary (Last 24 hours) at 08/28/2020 1033 Last data filed at 08/28/2020 0817 Gross per 24 hour  Intake 1264.37 ml  Output 1975 ml  Net -710.63 ml   Last 3 Weights 08/28/2020 08/27/2020  Weight (lbs) 190 lb 11.2 oz 190 lb  Weight (kg) 86.5 kg 86.183 kg      Telemetry    Normal sinus rhythm. - Personally Reviewed  ECG    Sinus rhythm with marked anterior biphasic/inverted T waves- Personally Reviewed  Physical Exam  Poor dentition GEN: No acute distress.   Neck: No JVD Cardiac: RRR, no murmurs, rubs.  There is an S4 gallop. Respiratory: Clear to auscultation bilaterally. GI: Soft, nontender, non-distended  MS:  Right forearm is firm, tender, but does not extend above the elbow.  Right hand is pink.  Access site in the right ulnar has good hemostasis. Neuro:  Nonfocal. Psych: Normal affect   Labs    High Sensitivity Troponin:   Recent Labs  Lab 08/27/20 2118 08/28/20 0256  TROPONINIHS 845* 616*      Chemistry Recent Labs   Lab 08/27/20 2118 08/27/20 2219 08/28/20 0256  NA 136 138 137  K 4.0 3.4* 3.9  CL 102 100 106  CO2 23  --  21*  GLUCOSE 98 103* 92  BUN 37* 37* 31*  CREATININE 1.03 0.80 0.91  CALCIUM 9.5  --  8.8*  PROT 6.6  --   --   ALBUMIN 3.5  --   --   AST 174*  --   --   ALT 147*  --   --   ALKPHOS 59  --   --   BILITOT 0.8  --   --   GFRNONAA >60  --  >60  ANIONGAP 11  --  10     Hematology Recent Labs  Lab 08/27/20 2118 08/27/20 2219 08/28/20 0256  WBC 12.8*  --  10.3  RBC 5.05  --  4.70  HGB 15.6 13.6 14.6  HCT 45.9 40.0 41.9  MCV 90.9  --  89.1  MCH 30.9  --  31.1  MCHC 34.0  --  34.8  RDW 13.2  --  13.1  PLT 375  --  349    BNPNo results for input(s): BNP, PROBNP in the last 168 hours.   DDimer No results  for input(s): DDIMER in the last 168 hours.   Radiology    CARDIAC CATHETERIZATION  Result Date: 08/27/2020  Prox LAD to Mid LAD lesion is 25% stenosed.  1st Diag lesion is 50% stenosed.  Patent LAD stent with mild residual thrombus in the distal portion of the stent with brisk TIMI 3 flow.  The initial ECG by EMS showed acute anterior ST elevation suggesting initial total occlusion secondary to thrombus which reperfused in the ER with heparin and NTG resulting in evolutionary T wave inversion prior to coming to the cath lab. The patient had not taken brilinta since his procedure on 08/21/20 resulting in acute stent thrombosis. Normal ramus, left circumflex and large dominant RCA. LVEDP 10 mm Hg. RECOMMENDATION: Continue aggrastat infusion until re-look cath on 08/29/20. Will resume heparin 8 hours post procedure. DAPT with ASA/brilint for at least 12 months. Aggressive lipid lowering with target LDL <70. Once IV NTG is dc'd initiate oral therapy with diagonal disease.  2D-echo in am. Consider post MI ACE-I/ARB and beta blocker as BP and HR allow.   DG Chest Port 1 View  Result Date: 08/27/2020 CLINICAL DATA:  Chest pain EXAM: PORTABLE CHEST 1 VIEW COMPARISON:   None.  August 21, 2020 FINDINGS: The heart size and mediastinal contours are within normal limits. There is a small left and trace right pleural effusion present. The visualized skeletal structures are unremarkable. IMPRESSION: Small left and trace right pleural effusion present. Electronically Signed   By: Jonna Clark M.D.   On: 08/27/2020 21:36    Cardiac Studies   Cardiac catheterization at [facility]: 08/21/2020  Indication: AAMI STEMI, VF arrest  Access: R femoral A&V  Comp: none  PLAD thrombotic occlusion; TIMI =0, instent LM, circ, RCA wnl  PCI: DES LAD 3.5x20 synergy, posted 3.5@18atm .  IVUS: Good stent apposition, moderate distal Stent edge plaque, no flap.   RHC: normal pressures. LVEDP and PCWP 16-18 range. TD CO 3.2  Echo in lab: EF 25-30%  REC: volume, continue supportive care. No MCS support required at this point but will observe closely   Patient Profile     44 y.o. male h/o CAD, s/p anterior STEMI 2011 and  last 08-21-20.  On the last occasion he received stent - in- stent for very late stent thrombosis.  Left hospital AMA, no DAPT since PCI w/ recurrent sx and EKG changes suggestive of acute stent thrombosis on presentation 08/27/2020.  LVEF in the setting of acute MI on 08/21/2020 was 25-30%.  Assessment & Plan    1.  Acute anterior ST elevation MI due to stent thrombosis, undergoing emergency cath demonstrating thrombus fragments within the stented region.  No intervention was performed.  He has stents in which with a 2011 stent and the most recent stent placed on 08/21/2020.  Did not take dual antiplatelet therapy after discharge from the hospital last week.  Left the hospital AGAINST MEDICAL ADVICE.  Some question of untreated psychiatric illness.  Plan restudy in a.m. from right femoral approach.  Discussed with patient.  He is not wild about the idea but understands the rationale. 2.  Acute on chronic systolic heart failure: EF at the time  of catheterization on 08/21/2000 was 25%.  We will get echocardiogram performed today. 3.  Swollen right arm: Likely related to microperforation of the ulna/brachial artery during the recent procedure.  Have decided to discontinue Aggrastat and will not give IV heparin to avoid the possibility of compartment syndrome.  Ace wrap is  applied for 4 to 6 hours to prevent any further bleeding.  Continue aspirin and Brilinta.    Given this patient's clinical situation including high risk of recurrent stent thrombosis, he will undergo repeat cardiac catheterization 08/29/2020 to document wide patency.  He has stent in-stent and is at high risk for future events.  Twice, he has developed ventricular fibrillation with the LAD is occluded (2011 and 2021).  Encouraged the patient to stop smoking.  Needs high intensity statin therapy.  For questions or updates, please contact [facility] Please consult [email or web address] for contact info under        Signed, Lesleigh Noe, MD  08/28/2020, 10:33 AM

## 2020-08-28 NOTE — Progress Notes (Signed)
Cardiology fellow paged at this time.  Pt is writhing in pain. Moaning, and groaning. Yelling "I am not complaining, something is wrong." States Left arm is numb and is requesting that a physician look at his arm.  TR band is off at this time. No bleeding to cath  site noted. No hematoma noted.  0630- Cardiology paged again at this time. Explained to patient that I was awaiting a return call from on call physician.

## 2020-08-28 NOTE — Progress Notes (Signed)
Shortened ace bandage applied to right forearm per Dr. Katrinka Blazing. Right forearm swollen distal to elbow but not up to cath stick site.  Spo2 98% as measured in right hand after ace bandage application. Brisk , less than 1 second, capillary refill also present after ace bandage application.  Per Dr. Katrinka Blazing ace bandage to remain for 3 hours until 11:00 am.

## 2020-08-28 NOTE — Progress Notes (Signed)
   Despite a very long conversation this morning concerning his underlying condition, the necessity for dual antiplatelet therapy, the high risk of recurrent acute stent thrombosis, and high risk nature of LAD occlusion with 2 prior episodes of VF, the patient absconded with his girlfriend without being noticed.  I was notified by his nurse that the patient was gone.  Security and charge nurse were also notified.  His outcome will be poor.

## 2020-08-28 NOTE — Progress Notes (Signed)
Spoke to Dr. Katrinka Blazing on unit.   Discussed issues overnight with releasing TR band and patients arm pain.   Physician aware that Heparin is on hold and Aggrastat is at half rate pending physician assessment of pt's right arm.  Physician at bedside for assessment.

## 2020-08-28 NOTE — Progress Notes (Signed)
  Echocardiogram 2D Echocardiogram has been performed.  Francisco Stewart 08/28/2020, 10:01 AM

## 2020-08-29 ENCOUNTER — Other Ambulatory Visit: Payer: Self-pay | Admitting: Student

## 2020-08-29 ENCOUNTER — Telehealth: Payer: Self-pay | Admitting: Cardiovascular Disease

## 2020-08-29 ENCOUNTER — Ambulatory Visit (HOSPITAL_COMMUNITY): Admit: 2020-08-29 | Payer: Self-pay | Admitting: Cardiovascular Disease

## 2020-08-29 ENCOUNTER — Encounter (HOSPITAL_COMMUNITY): Payer: Self-pay

## 2020-08-29 DIAGNOSIS — I2102 ST elevation (STEMI) myocardial infarction involving left anterior descending coronary artery: Secondary | ICD-10-CM

## 2020-08-29 SURGERY — LEFT HEART CATH AND CORONARY ANGIOGRAPHY
Anesthesia: LOCAL

## 2020-08-29 MED ORDER — TICAGRELOR 90 MG PO TABS: 90.0000 mg | ORAL_TABLET | Freq: Two times a day (BID) | ORAL | 0 refills | Status: DC

## 2020-08-29 NOTE — Progress Notes (Addendum)
Patient was scheduled for PCI today, looks like he left AMA, even after discussion with Dr Katrinka Blazing.  Attempted to call to see if he was still wanting to get the PCI done. No answer. Attempted to call emergency contact, left voice mail to call back, so we can set up PCI

## 2020-08-29 NOTE — Telephone Encounter (Signed)
I called Mr. Whitter to discuss Brilanta and the pt leaving AMA from hospital last night..Pt has had 2 STEMI's in less than a month. Pt wasn't taking his Brilanta after his first STEMI.   I discussed in detail the importance of him taking his Brilanta and following medical advice given to him by [facility]. Discussed with pt that not taking his meds could result in another MI or even death. Pt states he understands the importance of taking his meds and following up. Dr. Tresa Endo asked Marjie Skiff, PA-C to sent in script for [facility]. I confirmed with the pt that he has the 30 day free card.  Script was sent in to the  [facility] pharmacy by Marjie Skiff, PA-C per pt request. Pt states he will pick up this script within a hr. I made the pt an appt to see Edd Fabian, NP 11/24 @1 :45 pm at the [facility]. Pt states he will keep this appt.

## 2020-09-02 NOTE — Progress Notes (Signed)
Cardiology Clinic Note   Patient Name: Francisco Stewart Date of Encounter: 09/03/2020  Primary Care Provider:  Oneita Hurt, No Primary Cardiologist:  Nicki Guadalajara, MD  Patient Profile    Francisco Stewart 44 year old male presents to the clinic today for follow-up evaluation of his STEMI  Past Medical History    Past Medical History:  Diagnosis Date   Anxiety    UDS 08/21/2020 +THC   History of placement of stent in LAD coronary artery 08/21/2020   Smoker    Past Surgical History:  Procedure Laterality Date   CORONARY ANGIOPLASTY WITH STENT PLACEMENT  08/21/2020   stent x1 to LAD at [facility]   CORONARY ANGIOPLASTY WITH STENT PLACEMENT  2011   in [place] per pt, stents to LAD   LEFT HEART CATH AND CORONARY ANGIOGRAPHY N/A 08/27/2020   Procedure: LEFT HEART CATH AND CORONARY ANGIOGRAPHY;  Surgeon: Lennette Bihari, MD;  Location: [facility] INVASIVE CV LAB;  Service: Cardiovascular;  Laterality: N/A;    Allergies  No Known Allergies  History of Present Illness    [name] is a PMH of coronary artery disease, status post anterior STEMI 08/21/2020.  He was admitted at [facility] 08/21/2020 after presenting with anterior STEMI.  He previously had PCI to his LAD.  He was noted to have V. fib arrest x3 requiring multiple DCCV's prior to making it to the Cath Lab.  He was intubated and underwent cardiac catheterization receiving PCI to his LAD.  He developed recurrent chest pain with a residual ST elevation and was taken back to the Cath Lab.  No thrombus or residual significant lesions were noted.  With review of documentation his discharge was unclear.  Patient indicated that he just decided to leave.  He reported that he had to be escorted out of the hospital with security.  He did not have his Brilinta or prescriptions and therefore was not taking DAPT since his PCI 08/21/2020.  He reported intermittent chest discomfort since that time however, it  became more severe, similar to the symptoms he had with his STEMI, so he contacted EMS.  He was found to have recurrent ST elevations in his anterior leads.  He underwent repeat cardiac catheterization by Dr. Tresa Endo on 08/27/2020.  It showed proximal LAD-mid LAD lesion 25% stenosed, first diagonal lesion 50% stenosed.  His LAD stent was patent with mild residual thrombus in the distal portion of the stent.  It was felt that his initial EKG by EMS showed acute anterior STEMI which was suggestive of initial total occlusion secondary to thrombus which reperfused in the ER with heparin and nitroglycerin resulting in an evolutionary T wave perfusion prior to coming to the Cath Lab.  He was continued on his Aggrastat infusion until relook cath on 08/29/2020.  Plan was to resume heparin 8 hours post procedure.  Placed him on DAPT aspirin/Brilinta for 12 months, repeat echocardiogram, and start ACE/ARB and beta-blocker.  He again left the hospital AMA.  His case was discussed and it was recommended that he continue Brilinta x1 month and then transition to Plavix.  A prescription for Brilinta was sent to his pharmacy.  Order for Plavix was not placed.  He presents the clinic today for follow-up evaluation states he has been compliant with his Brilinta.  He has been working with hospital social work to work out [employer] for his hospital bill.  He has previously been on Plavix for around 6 months in 2011.  He  underwent cardiac catheterization at that time and took full-strength aspirin along with Plavix until he developed significant bruising then stop the medication.  He feels he is recovering much faster than he did when he was 35 during his first MI.  He was lost to follow-up after that time.  He states that he is greatly changed his diet and remains physically active.  He is agreeable with the plan of transitioning from Brilinta to Plavix in 3 weeks.  I also discussed starting statin medication due to his  elevated cholesterol.  He does not wish to start Lipitor but is willing to take rosuvastatin.  He has also greatly reduced his smoking.  He has some right forearm bruising at his cath site.  I will give him salty 6 diet sheet, start him on rosuvastatin 40, transition her into Plavix in 3 weeks, and see him back for follow-up appointment in 3 weeks.  We will repeat his fasting lipids and liver panel in 8 weeks.  Today he denies chest pain, shortness of breath, lower extremity edema, fatigue, palpitations, melena, hematuria, hemoptysis, diaphoresis, weakness, presyncope, syncope, orthopnea, and PND.   Home Medications    Prior to Admission medications   Medication Sig Start Date End Date Taking? Authorizing Provider  aspirin EC 81 MG tablet Take 81 mg by mouth daily. Swallow whole.    [provider]  ticagrelor (BRILINTA) 90 MG TABS tablet Take 1 tablet (90 mg total) by mouth 2 (two) times daily. 08/29/20   Corrin ParkerGoodrich, Callie E, PA-C    Family History    History reviewed. No pertinent family history. has no family status information on file.   Social History    Social History   Socioeconomic History   Marital status: Single    Spouse name: Not on file   Number of children: Not on file   Years of education: Not on file   Highest education level: Not on file  Occupational History   Not on file  Tobacco Use   Smoking status: Current Every Day Smoker    Types: Cigarettes   Smokeless tobacco: Never Used  Substance and Sexual Activity   Alcohol use: Not on file   Drug use: Not on file   Sexual activity: Not on file  Other Topics Concern   Not on file  Social History Narrative   Not on file   Social Determinants of Health   Financial Resource Strain:    Difficulty of Paying Living Expenses: Not on file  Food Insecurity:    Worried About Running Out of Food in the Last Year: Not on file   Ran Out of Food in the Last Year: Not on file  Transportation  Needs:    Lack of Transportation (Medical): Not on file   Lack of Transportation (Non-Medical): Not on file  Physical Activity:    Days of Exercise per Week: Not on file   Minutes of Exercise per Session: Not on file  Stress:    Feeling of Stress : Not on file  Social Connections:    Frequency of Communication with Friends and Family: Not on file   Frequency of Social Gatherings with Friends and Family: Not on file   Attends Religious Services: Not on file   Active Member of Clubs or Organizations: Not on file   Attends BankerClub or Organization Meetings: Not on file   Marital Status: Not on file  Intimate Partner Violence:    Fear of Current or Ex-Partner: Not on file  Emotionally Abused: Not on file   Physically Abused: Not on file   Sexually Abused: Not on file     Review of Systems    General:  No chills, fever, night sweats or weight changes.  Cardiovascular:  No chest pain, dyspnea on exertion, edema, orthopnea, palpitations, paroxysmal nocturnal dyspnea. Dermatological: No rash, lesions/masses Respiratory: No cough, dyspnea Urologic: No hematuria, dysuria Abdominal:   No nausea, vomiting, diarrhea, bright red blood per rectum, melena, or hematemesis Neurologic:  No visual changes, wkns, changes in mental status. All other systems reviewed and are otherwise negative except as noted above.  Physical Exam    VS:  BP 126/78 (BP Location: Left Arm, Patient Position: Sitting)    Pulse 80    Ht 6\' 2"  (1.88 m)    Wt 171 lb 12.8 oz (77.9 kg)    SpO2 97%    BMI 22.06 kg/m  , BMI Body mass index is 22.06 kg/m. GEN: Well nourished, well developed, in no acute distress. HEENT: normal. Neck: Supple, no JVD, carotid bruits, or masses. Cardiac: RRR, no murmurs, rubs, or gallops. No clubbing, cyanosis, edema.  Radials/DP/PT 2+ and equal bilaterally.  Respiratory:  Respirations regular and unlabored, clear to auscultation bilaterally. GI: Soft, nontender, nondistended, BS +  x 4. MS: no deformity or atrophy. Skin: warm and dry, no rash. Neuro:  Strength and sensation are intact. Psych: Normal affect.  Accessory Clinical Findings    Recent Labs: 08/27/2020: ALT 147 08/28/2020: BUN 31; Creatinine, Ser 0.91; Hemoglobin 14.6; Platelets 349; Potassium 3.9; Sodium 137   Recent Lipid Panel    Component Value Date/Time   CHOL 188 08/27/2020 2118   TRIG 164 (H) 08/27/2020 2118   HDL 44 08/27/2020 2118   CHOLHDL 4.3 08/27/2020 2118   VLDL 33 08/27/2020 2118   LDLCALC 111 (H) 08/27/2020 2118    ECG personally reviewed by me today-none today.  Echocardiogram 08/28/2020 IMPRESSIONS    1. Left ventricular ejection fraction, by estimation, is 30 to 35%. The  left ventricle has moderately decreased function. The left ventricle  demonstrates regional wall motion abnormalities (see scoring  diagram/findings for description).  Anterior/anteroseptal akinesis. The left ventricular internal cavity size  was mildly dilated. There is mild left ventricular hypertrophy. Left  ventricular diastolic parameters are indeterminate.  2. Recommend limited echo with contrast to rule out apical thrombus  3. Right ventricular systolic function is normal. The right ventricular  size is normal. Tricuspid regurgitation signal is inadequate for assessing  PA pressure.  4. The mitral valve is normal in structure. Trivial mitral valve  regurgitation.  5. The aortic valve was not well visualized. Aortic valve regurgitation  is not visualized. No aortic stenosis is present.  6. The inferior vena cava is normal in size with <50% respiratory  variability, suggesting right atrial pressure of 8 mmHg.  Cardiac catheterization 08/27/2020  Prox LAD to Mid LAD lesion is 25% stenosed.  1st Diag lesion is 50% stenosed.   Patent LAD stent with mild residual thrombus in the distal portion of the stent with brisk TIMI 3 flow.  The initial ECG by EMS showed acute anterior ST elevation  suggesting initial total occlusion secondary to thrombus which reperfused in the ER with heparin and NTG resulting in evolutionary T wave inversion prior to coming to the cath lab. The patient had not taken brilinta since his procedure on 08/21/20 resulting in acute stent thrombosis.  Normal ramus, left circumflex and large dominant RCA.  LVEDP 10 mm Hg.  RECOMMENDATION: Continue aggrastat infusion until re-look cath on 08/29/20. Will resume heparin 8 hours post procedure. DAPT with ASA/brilint for at least 12 months. Aggressive lipid lowering with target LDL <70. Once IV NTG is dc'd initiate oral therapy with diagonal disease.  2D-echo in am. Consider post MI ACE-I/ARB and beta blocker as BP and HR allow.   Diagnostic Dominance: Right  Intervention    Assessment & Plan   1.  STEMI-presented to [facility] hospital on 08/21/2020 and received a stent to his LAD.  He left AMA and did not receive Brilinta or aspirin.  He called EMS on 08/27/2020 reporting chest pain.  He was found to have ST elevation in his anterior leads.  He was taken back to the Cath Lab on 08/27/2020 details above.  He again left AMA.  A prescription for 1 month of Brilinta was sent to his pharmacy.  Case was discussed and plan to switch from Brilinta to Plavix after 1 month was felt to be appropriate. Continue Brilinta, aspirin Heart healthy low-sodium diet-salty 6 given Increase physical activity as tolerated  Hyperlipidemia-08/27/2020: Cholesterol 188; HDL 44; LDL Cholesterol 111; Triglycerides 164; VLDL 33 Start Crestor 40 Heart healthy low-sodium high-fiber diet Increase physical activity as tolerated Repeat lipid panel and LFTs and 8 weeks.  Disposition: Follow-up with Dr. Tresa Endo or  me in 3 weeks.  Thomasene Ripple. Takeysha Bonk NP-C    09/03/2020, 4:46 PM [facility] [address] Office [phone] Fax [phone]  Notice: This dictation was prepared with Dragon  dictation along with smaller phrase technology. Any transcriptional errors that result from this process are unintentional and may not be corrected upon review.

## 2020-09-03 ENCOUNTER — Other Ambulatory Visit: Payer: Self-pay

## 2020-09-03 ENCOUNTER — Encounter: Payer: Self-pay | Admitting: General Practice

## 2020-09-03 ENCOUNTER — Ambulatory Visit (INDEPENDENT_AMBULATORY_CARE_PROVIDER_SITE_OTHER): Payer: Self-pay | Admitting: General Practice

## 2020-09-03 VITALS — BP 126/78 | HR 80 | Ht 74.0 in | Wt 171.8 lb

## 2020-09-03 DIAGNOSIS — I2102 ST elevation (STEMI) myocardial infarction involving left anterior descending coronary artery: Secondary | ICD-10-CM

## 2020-09-03 DIAGNOSIS — Z79899 Other long term (current) drug therapy: Secondary | ICD-10-CM

## 2020-09-03 DIAGNOSIS — E7800 Pure hypercholesterolemia, unspecified: Secondary | ICD-10-CM

## 2020-09-03 MED ORDER — ROSUVASTATIN CALCIUM 40 MG PO TABS: 40.0000 mg | ORAL_TABLET | Freq: Every day | ORAL | 6 refills | Status: DC

## 2020-09-03 MED ORDER — ROSUVASTATIN CALCIUM 40 MG PO TABS: 40.0000 mg | ORAL_TABLET | Freq: Every day | ORAL | 3 refills | Status: DC

## 2020-09-03 NOTE — Patient Instructions (Addendum)
Medication Instructions:  START ROSUVASTATIN 40MG  DAILY *If you need a refill on your cardiac medications before your next appointment, please call your pharmacy*  Lab Work:    FASTING LIPID AND LFT IN 8 WEEKS (10-29-2020) HERE IN OUR OFFICE  Special Instructions PLEASE READ AND FOLLOW SALTY 6-ATTACHED-1,800mg  daily  PLEASE INCREASE PHYSICAL ACTIVITY AS TOLERATED  MAKE SURE TO TAKE YOUR BRILINTA MEDICATION EVERY DAY   Follow-Up: Your next appointment:  3 week(s) In Person with 10-31-2020, MD OR IF UNAVAILABLE JESSE CLEAVER, FNP-C  At [facility], you and your health needs are our priority.  As part of our continuing mission to provide you with exceptional heart care, we have created designated Provider Care Teams.  These Care Teams include your primary Cardiologist (physician) and Advanced Practice Providers (APPs -  Physician Assistants and Nurse Practitioners) who all work together to provide you with the care you need, when you need it.  We recommend signing up for the patient portal called "MyChart".  Sign up information is provided on this After Visit Summary.  MyChart is used to connect with patients for Virtual Visits (Telemedicine).  Patients are able to view lab/test results, encounter notes, upcoming appointments, etc.  Non-urgent messages can be sent to your provider as well.   To learn more about what you can do with MyChart, go to [facility].              6 SALTY THINGS TO AVOID     1,800MG  DAILY

## 2020-09-23 NOTE — Progress Notes (Deleted)
Error. No show.

## 2020-09-25 LAB — LIPID PANEL
Chol/HDL Ratio: 3.3 ratio (ref 0.0–5.0)
Cholesterol, Total: 223 mg/dL — ABNORMAL HIGH (ref 100–199)
HDL: 68 mg/dL (ref >39)
LDL Chol Calc (NIH): 144 mg/dL — ABNORMAL HIGH (ref 0–99)
Triglycerides: 61 mg/dL (ref 0–149)
VLDL Cholesterol Cal: 11 mg/dL (ref 5–40)

## 2020-09-25 LAB — HEPATIC FUNCTION PANEL
ALT: 42 IU/L (ref 0–44)
AST: 45 IU/L — ABNORMAL HIGH (ref 0–40)
Albumin: 4.4 g/dL (ref 4.0–5.0)
Alkaline Phosphatase: 75 IU/L (ref 44–121)
Bilirubin Total: 1 mg/dL (ref 0.0–1.2)
Bilirubin, Direct: 0.23 mg/dL (ref 0.00–0.40)
Total Protein: 6.4 g/dL (ref 6.0–8.5)

## 2020-09-26 ENCOUNTER — Ambulatory Visit: Payer: Self-pay | Admitting: General Practice

## 2020-09-27 ENCOUNTER — Other Ambulatory Visit: Payer: Self-pay | Admitting: Student

## 2020-09-27 DIAGNOSIS — I2102 ST elevation (STEMI) myocardial infarction involving left anterior descending coronary artery: Secondary | ICD-10-CM

## 2020-09-29 ENCOUNTER — Telehealth: Payer: Self-pay | Admitting: Cardiovascular Disease

## 2020-09-29 MED ORDER — CLOPIDOGREL BISULFATE 75 MG PO TABS: 75.0000 mg | ORAL_TABLET | Freq: Every day | ORAL | 3 refills | Status: DC

## 2020-09-29 NOTE — Telephone Encounter (Signed)
Called to notify pt, he will take Plavix loading dose tomorrow and will come to scheduled appt. Verbalized understanding. Thanks for "taking care" of him and [facility].

## 2020-09-29 NOTE — Telephone Encounter (Signed)
New Message  Pt c/o medication issue:  1. Name of Medication: ticagrelor (BRILINTA) 90 MG TABS tablet  2. How are you currently taking this medication (dosage and times per day)? Pt is out of medication   3. Are you having a reaction (difficulty breathing--STAT)? No   4. What is your medication issue? Pt is calling and says his Brilinta had run out and Thought a Prescription for Plavix would have been sent in.   [facility] [address] [place], [place] [address]  Please call

## 2020-09-29 NOTE — Telephone Encounter (Signed)
Mr. Francisco Stewart was in the show for his last appointment.  Due to financial reasons we had made plans to transition him from Brilinta to Plavix 1 month after his last appointment.  Please Stop Brilinta  Start loading dose of Plavix 600 mg then the next day start 75 mg daily.  He needs to have a follow-up appointment scheduled.  Thank you.  Francisco Stewart. Francisco Foulk NP-C    09/29/2020, 10:48 AM [facility] [address] Office [phone] Fax [phone]

## 2020-09-29 NOTE — Telephone Encounter (Signed)
PER Edd Fabian, FNP-C office note dated 09-03-2020: STEMI-presented to [facility] on 08/21/2020 and received a stent to his LAD.  He left AMA and did not receive Brilinta or aspirin.  He called EMS on 08/27/2020 reporting chest pain.  He was found to have ST elevation in his anterior leads.  He was taken back to the Cath Lab on 08/27/2020 details above.  He again left AMA.  A prescription for 1 month of Brilinta was sent to his pharmacy.  Case was discussed and plan to switch from Brilinta to Plavix after 1 month was felt to be appropriate. Continue Brilinta, aspirin

## 2020-09-29 NOTE — Telephone Encounter (Signed)
F/u appt scheduled & to discuss Plavix 10-29-2020@10am 

## 2020-09-29 NOTE — Telephone Encounter (Signed)
Tried to call pt and discuss, pt has no VM. Need to discuss f/u appt (pt did not make appt when he left the office needs appt ~10-21-19).  Tried to call pt tried to verify pt, cell phone disconnected. Upon call back no-answer-no VM. Will await call back

## 2020-09-29 NOTE — Telephone Encounter (Signed)
Patient is following up to discuss medication. He states the call dropped while previously speaking with the nurse.

## 2020-10-27 NOTE — Progress Notes (Deleted)
Cardiology Clinic Note   Patient Name: Francisco Stewart Date of Encounter: 10/27/2020  Primary Care Provider:  Oneita Hurt, No Primary Cardiologist:  Nicki Guadalajara, MD  Patient Profile    Francisco Stewart 45 year old male presents to the clinic today for follow-up evaluation of his coronary artery disease status post STEMI 08/27/2020.  Past Medical History    Past Medical History:  Diagnosis Date  . Anxiety    UDS 08/21/2020 +THC  . History of placement of stent in LAD coronary artery 08/21/2020  . Smoker    Past Surgical History:  Procedure Laterality Date  . CORONARY ANGIOPLASTY WITH STENT PLACEMENT  08/21/2020   stent x1 to LAD at [facility]  . CORONARY ANGIOPLASTY WITH STENT PLACEMENT  2011   in [place] per pt, stents to LAD  . LEFT HEART CATH AND CORONARY ANGIOGRAPHY N/A 08/27/2020   Procedure: LEFT HEART CATH AND CORONARY ANGIOGRAPHY;  Surgeon: Lennette Bihari, MD;  Location: [facility];  Service: Cardiovascular;  Laterality: N/A;    Allergies  No Known Allergies  History of Present Illness    Mr. Kirn is a PMH of coronary artery disease, status post anterior STEMI 08/21/2020.   He was admitted at [facility] 08/21/2020 after presenting with anterior STEMI.  He previously had PCI to his LAD.  He was noted to have V. fib arrest x3 requiring multiple DCCV's prior to making it to the Cath Lab.  He was intubated and underwent cardiac catheterization receiving PCI to his LAD.  He developed recurrent chest pain with a residual ST elevation and was taken back to the Cath Lab.  No thrombus or residual significant lesions were noted.  With review of documentation his discharge was unclear.  Patient indicated that he just decided to leave.  He reported that he had to be escorted out of the hospital with security.  He did not have his Brilinta or prescriptions and therefore was not taking DAPT since his PCI 08/21/2020.  He reported intermittent  chest discomfort since that time however, it became more severe, similar to the symptoms he had with his STEMI, so he contacted EMS.  He was found to have recurrent ST elevations in his anterior leads.   He underwent repeat cardiac catheterization by Dr. Tresa Endo on 08/27/2020.  It showed proximal LAD-mid LAD lesion 25% stenosed, first diagonal lesion 50% stenosed.  His LAD stent was patent with mild residual thrombus in the distal portion of the stent.  It was felt that his initial EKG by EMS showed acute anterior STEMI which was suggestive of initial total occlusion secondary to thrombus which reperfused in the ER with heparin and nitroglycerin resulting in an evolutionary T wave perfusion prior to coming to the Cath Lab.  He was continued on his Aggrastat infusion until relook cath on 08/29/2020.  Plan was to resume heparin 8 hours post procedure.  Placed him on DAPT aspirin/Brilinta for 12 months, repeat echocardiogram, and start ACE/ARB and beta-blocker.   He again left the hospital AMA.  His case was discussed and it was recommended that he continue Brilinta x1 month and then transition to Plavix.  A prescription for Brilinta was sent to his pharmacy.  Order for Plavix was not placed.   He presented the clinic 09/03/20 for follow-up evaluation stated he has been compliant with his Brilinta.  He had been working with hospital social work to work out [employer] for his hospital bill.  He had previously been  on Plavix for around 6 months in 2011.  He underwent cardiac catheterization at that time and took full-strength aspirin along with Plavix until he developed significant bruising then stop the medication.  He felt he was recovering much faster than he did when he was 35 during his first MI.  He was lost to follow-up after that time.  He stated that he had greatly changed his diet and remained physically active.  He was agreeable with the plan of transitioning from Brilinta to Plavix in 3 weeks.  I also  discussed starting statin medication due to his elevated cholesterol.  He did not wish to start Lipitor but was willing to take rosuvastatin.  He had also greatly reduced his smoking.  He had some right forearm bruising at his cath site.  I  gave him salty 6 diet sheet, start him on rosuvastatin 40, planned transition him to Plavix in 3 weeks, and see him back for follow-up appointment in 3 weeks.  We will repeat his fasting lipids and liver panel in 8 weeks.  He presents the clinic today for follow-up evaluation states***   Today he denies chest pain, shortness of breath, lower extremity edema, fatigue, palpitations, melena, hematuria, hemoptysis, diaphoresis, weakness, presyncope, syncope, orthopnea, and PND.  Home Medications    Prior to Admission medications   Medication Sig Start Date End Date Taking? Authorizing Provider  aspirin EC 81 MG tablet Take 81 mg by mouth daily. Swallow whole.    [provider]  clopidogrel (PLAVIX) 75 MG tablet Take 1 tablet (75 mg total) by mouth daily. Take loading dose 600mg (8 tab) on the first day then 75mg  daily thereafter 09/29/20   , NP  rosuvastatin (CRESTOR) 40 MG tablet Take 1 tablet (40 mg total) by mouth daily. 09/03/20 12/02/20  09/05/20, NP    Family History    No family history on file. has no family status information on file.   Social History    Social History   Socioeconomic History  . Marital status: Single    Spouse name: Not on file  . Number of children: Not on file  . Years of education: Not on file  . Highest education level: Not on file  Occupational History  . Not on file  Tobacco Use  . Smoking status: Current Every Day Smoker    Types: Cigarettes  . Smokeless tobacco: Never Used  Substance and Sexual Activity  . Alcohol use: Not on file  . Drug use: Not on file  . Sexual activity: Not on file  Other Topics Concern  . Not on file  Social History Narrative  . Not on file   Social  Determinants of Health   Financial Resource Strain: Not on file  Food Insecurity: Not on file  Transportation Needs: Not on file  Physical Activity: Not on file  Stress: Not on file  Social Connections: Not on file  Intimate Partner Violence: Not on file     Review of Systems    General:  No chills, fever, night sweats or weight changes.  Cardiovascular:  No chest pain, dyspnea on exertion, edema, orthopnea, palpitations, paroxysmal nocturnal dyspnea. Dermatological: No rash, lesions/masses Respiratory: No cough, dyspnea Urologic: No hematuria, dysuria Abdominal:   No nausea, vomiting, diarrhea, bright red blood per rectum, melena, or hematemesis Neurologic:  No visual changes, wkns, changes in mental status. All other systems reviewed and are otherwise negative except as noted above.  Physical Exam    VS:  There were no vitals taken for this visit. , BMI There is no height or weight on file to calculate BMI. GEN: Well nourished, well developed, in no acute distress. HEENT: normal. Neck: Supple, no JVD, carotid bruits, or masses. Cardiac: RRR, no murmurs, rubs, or gallops. No clubbing, cyanosis, edema.  Radials/DP/PT 2+ and equal bilaterally.  Respiratory:  Respirations regular and unlabored, clear to auscultation bilaterally. GI: Soft, nontender, nondistended, BS + x 4. MS: no deformity or atrophy. Skin: warm and dry, no rash. Neuro:  Strength and sensation are intact. Psych: Normal affect.  Accessory Clinical Findings    Recent Labs: 08/28/2020: BUN 31; Creatinine, Ser 0.91; Hemoglobin 14.6; Platelets 349; Potassium 3.9; Sodium 137 09/25/2020: ALT 42   Recent Lipid Panel    Component Value Date/Time   CHOL 223 (H) 09/25/2020 1124   TRIG 61 09/25/2020 1124   HDL 68 09/25/2020 1124   CHOLHDL 3.3 09/25/2020 1124   CHOLHDL 4.3 08/27/2020 2118   VLDL 33 08/27/2020 2118   LDLCALC 144 (H) 09/25/2020 1124    ECG personally reviewed by me today- *** - No acute  changes    Assessment & Plan   1.  ***   Thomasene Ripple. Svea Pusch NP-C    10/27/2020, 7:01 AM [facility] [address] Office [phone] Fax [phone]  Notice: This dictation was prepared with Dragon dictation along with smaller phrase technology. Any transcriptional errors that result from this process are unintentional and may not be corrected upon review.  I spent***minutes examining this patient, reviewing medications, and using patient centered shared decision making involving her cardiac care.  Prior to her visit I spent greater than 20 minutes reviewing her past medical history,  medications, and prior cardiac tests.

## 2020-10-29 ENCOUNTER — Ambulatory Visit: Payer: Self-pay | Admitting: General Practice

## 2020-11-18 NOTE — Progress Notes (Deleted)
Cardiology Clinic Note   Patient Name: Francisco Stewart Date of Encounter: 11/18/2020  Primary Care Provider:  Oneita Hurt, No Primary Cardiologist:  Nicki Guadalajara, MD  Patient Profile    Francisco Stewart 45 year old male presents to the clinic today for follow-up evaluation of his coronary artery disease status post STEMI 08/21/20.  Past Medical History    Past Medical History:  Diagnosis Date  . Anxiety    UDS 08/21/2020 +THC  . History of placement of stent in LAD coronary artery 08/21/2020  . Smoker    Past Surgical History:  Procedure Laterality Date  . CORONARY ANGIOPLASTY WITH STENT PLACEMENT  08/21/2020   stent [facility]1 to LAD at [facility]  . CORONARY ANGIOPLASTY WITH STENT PLACEMENT  2011   in [place] per pt, stents to LAD  . LEFT HEART CATH AND CORONARY ANGIOGRAPHY N/A 08/27/2020   Procedure: LEFT HEART CATH AND CORONARY ANGIOGRAPHY;  Surgeon: Lennette Bihari, MD;  Location: [facility] INVAS[facility] CV LAB;  Service: Cardiovascular;  Laterality: N/A;    Allergies  No Known Allergies  History of Present Illness    [name] is a PMH of coronary artery disease, status post anterior STEMI 08/21/2020.   He was admitted at [facility] 08/21/2020 after presenting with anterior STEMI.  He previously had PCI to his LAD.  He was noted to have V. fib arrest x3 requiring multiple DCCV's prior to making it to the Cath Lab.  He was intubated and underwent cardiac catheterization receiving PCI to his LAD.  He developed recurrent chest pain with a residual ST elevation and was taken back to the Cath Lab.  No thrombus or residual significant lesions were noted.  With review of documentation his discharge was unclear.  Patient indicated that he just decided to leave.  He reported that he had to be escorted out of the hospital with security.  He did not have his Brilinta or prescriptions and therefore was not taking DAPT since his PCI 08/21/2020.  He reported intermittent  chest discomfort since that time however, it became more severe, similar to the symptoms he had with his STEMI, so he contacted EMS.  He was found to have recurrent ST elevations in his anterior leads.   He underwent repeat cardiac catheterization by Dr. Tresa Endo on 08/27/2020.  It showed proximal LAD-mid LAD lesion 25% stenosed, first diagonal lesion 50% stenosed.  His LAD stent was patent with mild residual thrombus in the distal portion of the stent.  It was felt that his initial EKG by EMS showed acute anterior STEMI which was suggestive of initial total occlusion secondary to thrombus which reperfused in the ER with heparin and nitroglycerin resulting in an evolutionary T wave perfusion prior to coming to the Cath Lab.  He was continued on his Aggrastat infusion until relook cath on 08/29/2020.  Plan was to resume heparin 8 hours post procedure.  Placed him on DAPT aspirin/Brilinta for 12 months, repeat echocardiogram, and start ACE/ARB and beta-blocker.   He again left the hospital AMA.  His case was discussed and it was recommended that he continue Brilinta x1 month and then transition to Plavix.  A prescription for Brilinta was sent to his pharmacy.  Order for Plavix was not placed.   He presented the clinic 09/03/20 for follow-up evaluation stated he has been compliant with his Brilinta.  He had been working with hospital social work to work out [employer] for his hospital bill.  He had previously been  on Plavix for around 6 months in 2011.  He underwent cardiac catheterization at that time and took full-strength aspirin along with Plavix until he developed significant bruising then stop the medication.  He felt he was recovering much faster than he did when he was 35 during his first MI.  He was lost to follow-up after that time.  He stated that he had greatly changed his diet and remained physically active.  He was agreeable with the plan of transitioning from Brilinta to Plavix in 3 weeks.  I also  discussed starting statin medication due to his elevated cholesterol.  He did not wish to start Lipitor but was willing to take rosuvastatin.  He had also greatly reduced his smoking.  He had some right forearm bruising at his cath site.  I  gave him salty 6 diet sheet, start him on rosuvastatin 40, planned transition him to Plavix in 3 weeks, and see him back for follow-up appointment in 3 weeks.  We will repeat his fasting lipids and liver panel in 8 weeks.  He presents the clinic today for follow-up evaluation states***   Today he denies chest pain, shortness of breath, lower extremity edema, fatigue, palpitations, melena, hematuria, hemoptysis, diaphoresis, weakness, presyncope, syncope, orthopnea, and PND.  Home Medications    Prior to Admission medications   Medication Sig Start Date End Date Taking? Authorizing Provider  aspirin EC 81 MG tablet Take 81 mg by mouth daily. Swallow whole.    [provider]  clopidogrel (PLAVIX) 75 MG tablet Take 1 tablet (75 mg total) by mouth daily. Take loading dose 600mg (8 tab) on the first day then 75mg  daily thereafter 09/29/20   , NP  rosuvastatin (CRESTOR) 40 MG tablet Take 1 tablet (40 mg total) by mouth daily. 09/03/20 12/02/20  09/05/20, NP    Family History    No family history on file. has no family status information on file.   Social History    Social History   Socioeconomic History  . Marital status: Single    Spouse name: Not on file  . Number of children: Not on file  . Years of education: Not on file  . Highest education level: Not on file  Occupational History  . Not on file  Tobacco Use  . Smoking status: Current Every Day Smoker    Types: Cigarettes  . Smokeless tobacco: Never Used  Substance and Sexual Activity  . Alcohol use: Not on file  . Drug use: Not on file  . Sexual activity: Not on file  Other Topics Concern  . Not on file  Social History Narrative  . Not on file   Social  Determinants of Health   Financial Resource Strain: Not on file  Food Insecurity: Not on file  Transportation Needs: Not on file  Physical Activity: Not on file  Stress: Not on file  Social Connections: Not on file  Intimate Partner Violence: Not on file     Review of Systems    General:  No chills, fever, night sweats or weight changes.  Cardiovascular:  No chest pain, dyspnea on exertion, edema, orthopnea, palpitations, paroxysmal nocturnal dyspnea. Dermatological: No rash, lesions/masses Respiratory: No cough, dyspnea Urologic: No hematuria, dysuria Abdominal:   No nausea, vomiting, diarrhea, bright red blood per rectum, melena, or hematemesis Neurologic:  No visual changes, wkns, changes in mental status. All other systems reviewed and are otherwise negative except as noted above.  Physical Exam    VS:  There were no vitals taken for this visit. , BMI There is no height or weight on file to calculate BMI. GEN: Well nourished, well developed, in no acute distress. HEENT: normal. Neck: Supple, no JVD, carotid bruits, or masses. Cardiac: RRR, no murmurs, rubs, or gallops. No clubbing, cyanosis, edema.  Radials/DP/PT 2+ and equal bilaterally.  Respiratory:  Respirations regular and unlabored, clear to auscultation bilaterally. GI: Soft, nontender, nondistended, BS + x 4. MS: no deformity or atrophy. Skin: warm and dry, no rash. Neuro:  Strength and sensation are intact. Psych: Normal affect.  Accessory Clinical Findings    Recent Labs: 08/28/2020: BUN 31; Creatinine, Ser 0.91; Hemoglobin 14.6; Platelets 349; Potassium 3.9; Sodium 137 09/25/2020: ALT 42   Recent Lipid Panel    Component Value Date/Time   CHOL 223 (H) 09/25/2020 1124   TRIG 61 09/25/2020 1124   HDL 68 09/25/2020 1124   CHOLHDL 3.3 09/25/2020 1124   CHOLHDL 4.3 08/27/2020 2118   VLDL 33 08/27/2020 2118   LDLCALC 144 (H) 09/25/2020 1124    ECG personally reviewed by me today- *** - No acute  changes  Echocardiogram 08/28/2020 IMPRESSIONS     1. Left ventricular ejection fraction, by estimation, is 30 to 35%. The  left ventricle has moderately decreased function. The left ventricle  demonstrates regional wall motion abnormalities (see scoring  diagram/findings for description).  Anterior/anteroseptal akinesis. The left ventricular internal cavity size  was mildly dilated. There is mild left ventricular hypertrophy. Left  ventricular diastolic parameters are indeterminate.   2. Recommend limited echo with contrast to rule out apical thrombus   3. Right ventricular systolic function is normal. The right ventricular  size is normal. Tricuspid regurgitation signal is inadequate for assessing  PA pressure.   4. The mitral valve is normal in structure. Trivial mitral valve  regurgitation.   5. The aortic valve was not well visualized. Aortic valve regurgitation  is not visualized. No aortic stenosis is present.   6. The inferior vena cava is normal in size with <50% respiratory  variability, suggesting right atrial pressure of 8 mmHg.   Cardiac catheterization 08/27/2020  Prox LAD to Mid LAD lesion is 25% stenosed.  1st Diag lesion is 50% stenosed.   Patent LAD stent with mild residual thrombus in the distal portion of the stent with brisk TIMI 3 flow.  The initial ECG by EMS showed acute anterior ST elevation suggesting initial total occlusion secondary to thrombus which reperfused in the ER with heparin and NTG resulting in evolutionary T wave inversion prior to coming to the cath lab. The patient had not taken brilinta since his procedure on 08/21/20 resulting in acute stent thrombosis.   Normal ramus, left circumflex and large dominant RCA.   LVEDP 10 mm Hg.   RECOMMENDATION: Continue aggrastat infusion until re-look cath on 08/29/20. Will resume heparin 8 hours post procedure. DAPT with ASA/brilint for at least 12 months. Aggressive lipid lowering with target LDL <70.  Once IV NTG is dc'd initiate oral therapy with diagonal disease.  2D-echo in am. Consider post MI ACE-I/ARB and beta blocker as BP and HR allow.    Diagnostic Dominance: Right  Intervention     Assessment & Plan   1.  STEMI-no chest pain today.  Reports compliance with his medications.  Presented to [facility] hospital on 08/21/2020 and received a stent to his LAD.  He left AMA and did not receive Brilinta or aspirin.  He called EMS on 08/27/2020 reporting chest  pain.  He was found to have ST elevation in his anterior leads.  He was taken back to the Cath Lab on 08/27/2020 details above.  He again left AMA.  A prescription for 1 month of Brilinta was sent to his pharmacy.   Stop Brilinta*** Start loading dose of Plavix 600 mg and then 75 mg daily Continue Brilinta, aspirin Heart healthy low-sodium diet-salty 6 given Increase physical activity as tolerated   Hyperlipidemia-08/27/2020: Cholesterol 188; HDL 44; LDL Cholesterol 111; Triglycerides 164; VLDL 33 Start Crestor 40 Heart healthy low-sodium high-fiber diet Increase physical activity as tolerated Repeat lipid panel and LFTs and 5 weeks.***   Disposition: Follow-up with Dr. Tresa Endo or  me in  6 months.   Thomasene Ripple. Ryelee Albee NP-C    11/18/2020, 8:09 AM [facility] [address] Office [phone] Fax [phone]  Notice: This dictation was prepared with Dragon dictation along with smaller phrase technology. Any transcriptional errors that result from this process are unintentional and may not be corrected upon review.  I spent***minutes examining this patient, reviewing medications, and using patient centered shared decision making involving her cardiac care.  Prior to her visit I spent greater than 20 minutes reviewing her past medical history,  medications, and prior cardiac tests.

## 2020-11-20 ENCOUNTER — Ambulatory Visit: Payer: Self-pay | Admitting: General Practice

## 2020-11-23 NOTE — Progress Notes (Signed)
Cardiology Clinic Note   Patient Name: Francisco Stewart Date of Encounter: 11/24/2020  Primary Care Provider:  Pcp, No Primary Cardiologist:  Nicki Guadalajara, MD  Patient Profile    Francisco Stewart 45 year old male presents to the clinic today for follow-up evaluation of his coronary artery disease status post STEMI 08/21/20.  Past Medical History    Past Medical History:  Diagnosis Date  . Anxiety    UDS 08/21/2020 +THC  . History of placement of stent in LAD coronary artery 08/21/2020  . Smoker    Past Surgical History:  Procedure Laterality Date  . CORONARY ANGIOPLASTY WITH STENT PLACEMENT  08/21/2020   stent x1 to LAD at [facility]  . CORONARY ANGIOPLASTY WITH STENT PLACEMENT  2011   in [place] per pt, stents to LAD  . LEFT HEART CATH AND CORONARY ANGIOGRAPHY N/A 08/27/2020   Procedure: LEFT HEART CATH AND CORONARY ANGIOGRAPHY;  Surgeon: Lennette Bihari, MD;  Location: [facility] INVASIVE CV LAB;  Service: Cardiovascular;  Laterality: N/A;    Allergies  No Known Allergies  History of Present Illness    Francisco Stewart is a PMH of coronary artery disease, status post anterior STEMI 08/21/2020.   He was admitted at [facility] 08/21/2020 after presenting with anterior STEMI.  He previously had PCI to his LAD.  He was noted to have V. fib arrest x3 requiring multiple DCCV's prior to making it to the Cath Lab.  He was intubated and underwent cardiac catheterization receiving PCI to his LAD.  He developed recurrent chest pain with a residual ST elevation and was taken back to the Cath Lab.  No thrombus or residual significant lesions were noted.  With review of documentation his discharge was unclear.  Patient indicated that he just decided to leave.  He reported that he had to be escorted out of the hospital with security.  He did not have his Brilinta or prescriptions and therefore was not taking DAPT since his PCI 08/21/2020.  He reported intermittent  chest discomfort since that time however, it became more severe, similar to the symptoms he had with his STEMI, so he contacted EMS.  He was found to have recurrent ST elevations in his anterior leads.   He underwent repeat cardiac catheterization by Dr. Tresa Endo on 08/27/2020.  It showed proximal LAD-mid LAD lesion 25% stenosed, first diagonal lesion 50% stenosed.  His LAD stent was patent with mild residual thrombus in the distal portion of the stent.  It was felt that his initial EKG by EMS showed acute anterior STEMI which was suggestive of initial total occlusion secondary to thrombus which reperfused in the ER with heparin and nitroglycerin resulting in an evolutionary T wave perfusion prior to coming to the Cath Lab.  He was continued on his Aggrastat infusion until relook cath on 08/29/2020.  Plan was to resume heparin 8 hours post procedure.  Placed him on DAPT aspirin/Brilinta for 12 months, repeat echocardiogram, and start ACE/ARB and beta-blocker.   He again left the hospital AMA.  His case was discussed and it was recommended that he continue Brilinta x1 month and then transition to Plavix.  A prescription for Brilinta was sent to his pharmacy.  Order for Plavix was not placed.   He presented the clinic 09/03/20 for follow-up evaluation stated he has been compliant with his Brilinta.  He had been working with hospital social work to work out [employer] for his hospital bill.  He had previously been  on Plavix for around 6 months in 2011.  He underwent cardiac catheterization at that time and took full-strength aspirin along with Plavix until he developed significant bruising then stop the medication.  He felt he was recovering much faster than he did when he was 35 during his first MI.  He was lost to follow-up after that time.  He stated that he had greatly changed his diet and remained physically active.  He was agreeable with the plan of transitioning from Brilinta to Plavix in 3 weeks.  I also  discussed starting statin medication due to his elevated cholesterol.  He did not wish to start Lipitor but was willing to take rosuvastatin.  He had also greatly reduced his smoking.  He had some right forearm bruising at his cath site.  I  gave him salty 6 diet sheet, start him on rosuvastatin 40, planned transition him to Plavix in 3 weeks, and see him back for follow-up appointment in 3 weeks.  We will repeat his fasting lipids and liver panel in 8 weeks.  He presents the clinic today for follow-up evaluation states he is feeling fairly well.  He reports that he has been using his stationary bike.  However, in the last month he has been less consistent with his exercise.  He has been maintaining a Mediterranean style low-salt diet.  He reports intermittent episodes of brief palpitations.  He has tried to increase his hydration and cut out caffeine which he notes has made a difference and the frequency of his palpitations.  He reports compliance with his medications and no side effects.  We will start him on Entresto 24/26, order a BMP in a week, repeat his fasting lipids and liver.  We will have him increase his physical activity as tolerated and follow-up in 1 month.   Today he denies chest pain, shortness of breath, lower extremity edema, fatigue, palpitations, melena, hematuria, hemoptysis, diaphoresis, weakness, presyncope, syncope, orthopnea, and PND.  Home Medications    Prior to Admission medications   Medication Sig Start Date End Date Taking? Authorizing Provider  aspirin EC 81 MG tablet Take 81 mg by mouth daily. Swallow whole.    [provider]  clopidogrel (PLAVIX) 75 MG tablet Take 1 tablet (75 mg total) by mouth daily. Take loading dose 600mg (8 tab) on the first day then 75mg  daily thereafter 09/29/20   , NP  rosuvastatin (CRESTOR) 40 MG tablet Take 1 tablet (40 mg total) by mouth daily. 09/03/20 12/02/20  09/05/20, NP    Family History    History  reviewed. No pertinent family history. has no family status information on file.   Social History    Social History   Socioeconomic History  . Marital status: Single    Spouse name: Not on file  . Number of children: Not on file  . Years of education: Not on file  . Highest education level: Not on file  Occupational History  . Not on file  Tobacco Use  . Smoking status: Current Every Day Smoker    Types: Cigarettes  . Smokeless tobacco: Never Used  Substance and Sexual Activity  . Alcohol use: Not on file  . Drug use: Not on file  . Sexual activity: Not on file  Other Topics Concern  . Not on file  Social History Narrative  . Not on file   Social Determinants of Health   Financial Resource Strain: Not on file  Food Insecurity: Not on file  Transportation Needs: Not on file  Physical Activity: Not on file  Stress: Not on file  Social Connections: Not on file  Intimate Partner Violence: Not on file     Review of Systems    General:  No chills, fever, night sweats or weight changes.  Cardiovascular:  No chest pain, dyspnea on exertion, edema, orthopnea, palpitations, paroxysmal nocturnal dyspnea. Dermatological: No rash, lesions/masses Respiratory: No cough, dyspnea Urologic: No hematuria, dysuria Abdominal:   No nausea, vomiting, diarrhea, bright red blood per rectum, melena, or hematemesis Neurologic:  No visual changes, wkns, changes in mental status. All other systems reviewed and are otherwise negative except as noted above.  Physical Exam    VS:  BP 124/81 (BP Location: Left Arm, Patient Position: Sitting, Cuff Size: Normal)   Pulse 85   Ht 6\' 2"  (1.88 m)   Wt 178 lb 3.2 oz (80.8 kg)   BMI 22.88 kg/m  , BMI Body mass index is 22.88 kg/m. GEN: Well nourished, well developed, in no acute distress. HEENT: normal. Neck: Supple, no JVD, carotid bruits, or masses. Cardiac: RRR, no murmurs, rubs, or gallops. No clubbing, cyanosis, edema.  Radials/DP/PT 2+ and  equal bilaterally.  Respiratory:  Respirations regular and unlabored, clear to auscultation bilaterally. GI: Soft, nontender, nondistended, BS + x 4. MS: no deformity or atrophy. Skin: warm and dry, no rash. Neuro:  Strength and sensation are intact. Psych: Normal affect.  Accessory Clinical Findings    Recent Labs: 08/28/2020: BUN 31; Creatinine, Ser 0.91; Hemoglobin 14.6; Platelets 349; Potassium 3.9; Sodium 137 09/25/2020: ALT 42   Recent Lipid Panel    Component Value Date/Time   CHOL 223 (H) 09/25/2020 1124   TRIG 61 09/25/2020 1124   HDL 68 09/25/2020 1124   CHOLHDL 3.3 09/25/2020 1124   CHOLHDL 4.3 08/27/2020 2118   VLDL 33 08/27/2020 2118   LDLCALC 144 (H) 09/25/2020 1124    ECG personally reviewed by me today-none today.  Echocardiogram 08/28/2020 IMPRESSIONS     1. Left ventricular ejection fraction, by estimation, is 30 to 35%. The  left ventricle has moderately decreased function. The left ventricle  demonstrates regional wall motion abnormalities (see scoring  diagram/findings for description).  Anterior/anteroseptal akinesis. The left ventricular internal cavity size  was mildly dilated. There is mild left ventricular hypertrophy. Left  ventricular diastolic parameters are indeterminate.   2. Recommend limited echo with contrast to rule out apical thrombus   3. Right ventricular systolic function is normal. The right ventricular  size is normal. Tricuspid regurgitation signal is inadequate for assessing  PA pressure.   4. The mitral valve is normal in structure. Trivial mitral valve  regurgitation.   5. The aortic valve was not well visualized. Aortic valve regurgitation  is not visualized. No aortic stenosis is present.   6. The inferior vena cava is normal in size with <50% respiratory  variability, suggesting right atrial pressure of 8 mmHg.   Cardiac catheterization 08/27/2020  Prox LAD to Mid LAD lesion is 25% stenosed.  1st Diag lesion is 50%  stenosed.   Patent LAD stent with mild residual thrombus in the distal portion of the stent with brisk TIMI 3 flow.  The initial ECG by EMS showed acute anterior ST elevation suggesting initial total occlusion secondary to thrombus which reperfused in the ER with heparin and NTG resulting in evolutionary T wave inversion prior to coming to the cath lab. The patient had not taken brilinta since his procedure on 08/21/20 resulting in acute  stent thrombosis.   Normal ramus, left circumflex and large dominant RCA.   LVEDP 10 mm Hg.   RECOMMENDATION: Continue aggrastat infusion until re-look cath on 08/29/20. Will resume heparin 8 hours post procedure. DAPT with ASA/brilint for at least 12 months. Aggressive lipid lowering with target LDL <70. Once IV NTG is dc'd initiate oral therapy with diagonal disease.  2D-echo in am. Consider post MI ACE-I/ARB and beta blocker as BP and HR allow.   Diagnostic Dominance: Right    Intervention    Assessment & Plan   1.  STEMI-no chest pain today.  Reports compliance with his medications.  Presented to [facility] hospital on 08/21/2020 and received a stent to his LAD.  He left AMA and did not receive Brilinta or aspirin.  He called EMS on 08/27/2020 reporting chest pain.  He was found to have ST elevation in his anterior leads.  He was taken back to the Cath Lab on 08/27/2020 details above.  He again left AMA.  A prescription for 1 month of Brilinta was sent to his pharmacy.   Continue Plavix 75 mg daily, aspirin Heart healthy low-sodium diet-salty 6 given Increase physical activity as tolerated   Acute systolic heart failure-notices continued shortness of breath with increased physical activity.  Noted to have 30-35% EF on 08/28/2020 echocardiogram.  Has been slowly increasing his physical activity. Start Entresto 2426 Heart healthy low-sodium diet Increase physical activity as tolerated Order BMP in 1 week  Hyperlipidemia-08/27/2020:  Cholesterol 188; HDL 44; LDL Cholesterol 111; Triglycerides 164; VLDL 33 Start Crestor 40 Heart healthy low-sodium high-fiber diet Increase physical activity as tolerated Repeat lipid panel and LFTs.   Palpitations- intermittently for seconds then dicipaties on own.  Reports he has cut out caffeine.  Continue to monitor. Avoid triggers caffeine, chocolate, EtOH, dehydration etc.  Disposition: Follow-up with Dr. Tresa Endo or  me in  1 months.  Thomasene Ripple. Lucyann Romano NP-C    11/24/2020, 9:53 AM [facility] [address] Office [phone] Fax [phone]  Notice: This dictation was prepared with Dragon dictation along with smaller phrase technology. Any transcriptional errors that result from this process are unintentional and may not be corrected upon review.  I spent 15 minutes examining this patient, reviewing medications, and using patient centered shared decision making involving her cardiac care.  Prior to her visit I spent greater than 20 minutes reviewing her past medical history,  medications, and prior cardiac tests.

## 2020-11-24 ENCOUNTER — Other Ambulatory Visit: Payer: Self-pay

## 2020-11-24 ENCOUNTER — Encounter: Payer: Self-pay | Admitting: General Practice

## 2020-11-24 ENCOUNTER — Other Ambulatory Visit: Payer: Self-pay | Admitting: General Practice

## 2020-11-24 ENCOUNTER — Ambulatory Visit (INDEPENDENT_AMBULATORY_CARE_PROVIDER_SITE_OTHER): Payer: Self-pay | Admitting: General Practice

## 2020-11-24 VITALS — BP 124/81 | HR 85 | Ht 74.0 in | Wt 178.2 lb

## 2020-11-24 DIAGNOSIS — I2102 ST elevation (STEMI) myocardial infarction involving left anterior descending coronary artery: Secondary | ICD-10-CM

## 2020-11-24 DIAGNOSIS — I5021 Acute systolic (congestive) heart failure: Secondary | ICD-10-CM

## 2020-11-24 DIAGNOSIS — E7800 Pure hypercholesterolemia, unspecified: Secondary | ICD-10-CM

## 2020-11-24 DIAGNOSIS — Z79899 Other long term (current) drug therapy: Secondary | ICD-10-CM

## 2020-11-24 MED ORDER — SACUBITRIL-VALSARTAN 24-26 MG PO TABS: 1.0000 | ORAL_TABLET | Freq: Two times a day (BID) | ORAL | 6 refills | Status: DC

## 2020-11-24 NOTE — Patient Instructions (Signed)
Medication Instructions:  START ENTRESTO 24/26 TWICE DAILY *If you need a refill on your cardiac medications before your next appointment, please call your pharmacy*  Lab Work: FALSTING LIPID AND LFT TODAY AND BMET IN 1 WEEK If you have labs (blood work) drawn today and your tests are completely normal, you will receive your results only by:  MyChart Message (if you have MyChart) OR A paper copy in the mail.  If you have any lab test that is abnormal or we need to change your treatment, we will call you to review the results. You may go to any [facility] that is convenient for you however, we do have a lab in our office that is able to assist you. You DO NOT need an appointment for our lab. The lab is open 8:00am and closes at 4:00pm. Lunch 12:45 - 1:45pm.  Special Instructions START USING STATIONARY BIKE  PLEASE READ AND FOLLOW SALTY 6-ATTACHED-1,800 mg daily  PLEASE INCREASE PHYSICAL ACTIVITY AS TOLERATED  Follow-Up: Your next appointment:  1 month(s) In Person with Chilton Si, MD OR IF UNAVAILABLE JESSE CLEAVER, FNP-C  At [facility], you and your health needs are our priority.  As part of our continuing mission to provide you with exceptional heart care, we have created designated Provider Care Teams.  These Care Teams include your primary Cardiologist (physician) and Advanced Practice Providers (APPs -  Physician Assistants and Nurse Practitioners) who all work together to provide you with the care you need, when you need it.

## 2020-11-25 ENCOUNTER — Telehealth: Payer: Self-pay

## 2020-11-25 DIAGNOSIS — Z79899 Other long term (current) drug therapy: Secondary | ICD-10-CM

## 2020-11-25 LAB — BASIC METABOLIC PANEL
BUN/Creatinine Ratio: 16 (ref 9–20)
BUN: 17 mg/dL (ref 6–24)
CO2: 25 mmol/L (ref 20–29)
Calcium: 9.9 mg/dL (ref 8.7–10.2)
Chloride: 103 mmol/L (ref 96–106)
Creatinine, Ser: 1.08 mg/dL (ref 0.76–1.27)
GFR calc Af Amer: 95 mL/min/{1.73_m2} (ref >59)
GFR calc non Af Amer: 82 mL/min/{1.73_m2} (ref >59)
Glucose: 83 mg/dL (ref 65–99)
Potassium: 5.5 mmol/L — ABNORMAL HIGH (ref 3.5–5.2)
Sodium: 143 mmol/L (ref 134–144)

## 2020-11-25 NOTE — Telephone Encounter (Addendum)
Called patient and could not leave a voice message due to voicemail box not set up. I will try calling again.  ----- Message from Ronney Asters, NP sent at 11/25/2020 12:30 PM EST ----- Please contact Francisco Stewart and let him know that his lab values from yesterday have been reviewed.  His potassium was elevated at 5.5.  He will need to have his BMP redrawn today or early tomorrow to verify his potassium level.  Please ask him to discontinue any extra potassium supplementation as well as multivitamins, sports drinks, and other high potassium containing foods at this time.  Thank you.

## 2020-11-26 NOTE — Addendum Note (Signed)
Addended by: Alyson Ingles on: 11/26/2020 03:24 PM   Modules accepted: Orders

## 2020-12-06 LAB — LIPID PANEL
Chol/HDL Ratio: 3.7 ratio (ref 0.0–5.0)
Cholesterol, Total: 217 mg/dL — ABNORMAL HIGH (ref 100–199)
HDL: 58 mg/dL (ref >39)
LDL Chol Calc (NIH): 146 mg/dL — ABNORMAL HIGH (ref 0–99)
Triglycerides: 74 mg/dL (ref 0–149)
VLDL Cholesterol Cal: 13 mg/dL (ref 5–40)

## 2020-12-06 LAB — BASIC METABOLIC PANEL
BUN/Creatinine Ratio: 18 (ref 9–20)
BUN: 20 mg/dL (ref 6–24)
CO2: 23 mmol/L (ref 20–29)
Calcium: 9.4 mg/dL (ref 8.7–10.2)
Chloride: 98 mmol/L (ref 96–106)
Creatinine, Ser: 1.1 mg/dL (ref 0.76–1.27)
GFR calc Af Amer: 93 mL/min/{1.73_m2} (ref >59)
GFR calc non Af Amer: 80 mL/min/{1.73_m2} (ref >59)
Glucose: 75 mg/dL (ref 65–99)
Potassium: 4.7 mmol/L (ref 3.5–5.2)
Sodium: 136 mmol/L (ref 134–144)

## 2020-12-06 LAB — HEPATIC FUNCTION PANEL
ALT: 38 IU/L (ref 0–44)
AST: 39 IU/L (ref 0–40)
Albumin: 4.6 g/dL (ref 4.0–5.0)
Alkaline Phosphatase: 72 IU/L (ref 44–121)
Bilirubin Total: 0.9 mg/dL (ref 0.0–1.2)
Bilirubin, Direct: 0.21 mg/dL (ref 0.00–0.40)
Total Protein: 7.1 g/dL (ref 6.0–8.5)

## 2020-12-22 NOTE — Progress Notes (Unsigned)
Cardiology Clinic Note   Patient Name: Bertel Goldston Date of Encounter: 12/23/2020  Primary Care Provider:  Pcp, No Primary Cardiologist:  Nicki Guadalajarahomas Kelly, MD  Patient Profile    Carma LairMargarito Linerlan Kirstein 45 year old male presents to the clinic today for follow-up evaluation of his coronary artery disease status post STEMI 08/21/20 and systolic heart failure.  Past Medical History    Past Medical History:  Diagnosis Date  . Anxiety    UDS 08/21/2020 +THC  . History of placement of stent in LAD coronary artery 08/21/2020  . Smoker    Past Surgical History:  Procedure Laterality Date  . CORONARY ANGIOPLASTY WITH STENT PLACEMENT  08/21/2020   [facility]  . CORONARY ANGIOPLASTY WITH STENT PLACEMENT  2011   [place], stents to LAD  . LEFT HEART CATH AND CORONARY ANGIOGRAPHY N/A 08/27/2020   Procedure: LEFT HEART CATH AND CORONARY ANGIOGRAPHY;  Surgeon: Lennette BihariKelly, Thomas A, MD;  Location: [facility] INVASIVE CV LAB;  Service: Cardiovascular;  Laterality: N/A;    Allergies  No Known Allergies  History of Present Illness    Mr. Lelon PerlaSaunders is a PMH of coronary artery disease, status post anterior STEMI 08/21/2020.  His PMH also includes systolic heart failure with an LVEF of 30-35%.  He was admitted at [facility] 08[facility]21/2020 after presenting with anterior STEMI. He previously had PCI to his LAD. He was noted to have V. fib arrest x3 requiring multiple DCCV's prior to making it to the Cath Lab. He was intubated and underwent cardiac catheterization receiving PCI to his LAD. He developed recurrent chest pain with a residual ST elevation and was taken back to the Cath Lab. No thrombus or residual significant lesions were noted. With review of documentation his discharge was unclear. Patient indicated that he just decided to leave. He reported that he had to be escorted out of the hospital with security. He did not have his Brilinta or  prescriptions and therefore was not taking DAPT since his PCI 08/21/2020. He reported intermittent chest discomfort since that time however, it became more severe, similar to the symptoms he had with his STEMI, so he contacted EMS. He was found to have recurrent ST elevations in his anterior leads.  He underwent repeat cardiac catheterization by Dr. Tresa EndoKelly on 08/27/2020. It showed proximal LAD-mid LAD lesion 25% stenosed, first diagonal lesion 50% stenosed. His LAD stent was patent with mild residual thrombus in the distal portion of the stent. It was felt that his initial EKG by EMS showed acute anterior STEMI which was suggestive of initial total occlusion secondary to thrombus which reperfused in the ER with heparin and nitroglycerin resulting in an evolutionary T wave perfusion prior to coming to the Cath Lab. He was continued on his Aggrastat infusion until relook cath on 08/29/2020. Plan was to resume heparin 8 hours post procedure. Placed him on DAPT aspirin/Brilinta for 12 months, repeat echocardiogram, and start ACE/ARB and beta-blocker.  He again left the hospital AMA. His case was discussed and it was recommended that he continue Brilinta x1 month and then transition to Plavix. A prescription for Brilinta was sent to his pharmacy. Order for Plavix was not placed.  He presented the clinic 09/03/20 for follow-up evaluation statedhe has been compliant with his Brilinta. He had been working with hospital social work to work out [employer] plans for his hospital bill. He had previously been on Plavix for around 6 months in 2011. He underwent  cardiac catheterization at that time and took full-strength aspirin along with Plavix until he developed significant bruising then stop the medication. He felt he was recovering much faster than he did when he was 35 during his first MI. He was lost to follow-up after that time. He stated that he had greatly changed his diet and remained physically  active. He was agreeable with the plan of transitioning from Brilinta to Plavix in 3 weeks. I also discussed starting statin medication due to his elevated cholesterol. He did not wish to start Lipitor but was willing to take rosuvastatin. He had also greatly reduced his smoking. He had some right forearm bruising at his cath site. I  gave him salty 6 diet sheet, start him on rosuvastatin 40, planned transition him to Plavix in 3 weeks, and see him back for follow-up appointment in 3 weeks. We will repeat his fasting lipids and liver panel in 8 weeks.  He presented to the clinic 12/04/2020 for follow-up evaluation states he was feeling fairly well.  He reported that he had been using his stationary bike.  However, in the last month he had been less consistent with his exercise.  He had been maintaining a Mediterranean style low-salt diet.  He reported intermittent episodes of brief palpitations.  He had tried to increase his hydration and cut out caffeine which he noted  made a difference in the frequency of his palpitations.  He reported compliance with his medications and no side effects.  We  started him on Entresto 24/26, ordered a BMP in a week, repeated his fasting lipids and liver.  We plan to have him increase his physical activity as tolerated and follow-up in 1 month.  He presents the clinic today for follow-up evaluation and states he has had some blood pressures in the 90s over 70s.  He reports he continues to try to stay physically active.  He reports that his palpitations feel less strong after starting Entresto.  We discussed his cholesterol panel and he reports compliance with his rosuvastatin and high-fiber diet.  I will continue his Entresto at the current dose and schedule an echocardiogram for 1 month.  We will prescribe ezetimibe 10 mg daily and repeat his fasting lipids and liver in 8 weeks.  We will plan follow-up for 3 months with Dr. Tresa Endo.  Today hedenies chest pain,  shortness of breath, lower extremity edema, fatigue, palpitations, melena, hematuria, hemoptysis, diaphoresis, weakness, presyncope, syncope, orthopnea, and PND.  Home Medications    Prior to Admission medications   Medication Sig Start Date End Date Taking? Authorizing Provider  aspirin EC 81 MG tablet Take 81 mg by mouth daily. Swallow whole.    [provider]  clopidogrel (PLAVIX) 75 MG tablet Take 1 tablet (75 mg total) by mouth daily. Take loading dose 600mg (8 tab) on the first day then 75mg  daily thereafter 09/29/20   , NP  rosuvastatin (CRESTOR) 40 MG tablet Take 1 tablet (40 mg total) by mouth daily. 09/03/20 12/02/20  09/05/20, NP  sacubitril-valsartan (ENTRESTO) 24-26 MG Take 1 tablet by mouth 2 (two) times daily. 11/24/20   Ronney Asters, NP    Family History    History reviewed. No pertinent family history. has no family status information on file.   Social History    Social History   Socioeconomic History  . Marital status: Single    Spouse name: Not on file  . Number of children: Not on file  . Years  of education: Not on file  . Highest education level: Not on file  Occupational History  . Not on file  Tobacco Use  . Smoking status: Current Every Day Smoker    Types: Cigarettes  . Smokeless tobacco: Never Used  Substance and Sexual Activity  . Alcohol use: Not on file  . Drug use: Not on file  . Sexual activity: Not on file  Other Topics Concern  . Not on file  Social History Narrative  . Not on file   Social Determinants of Health   Financial Resource Strain: Not on file  Food Insecurity: Not on file  Transportation Needs: Not on file  Physical Activity: Not on file  Stress: Not on file  Social Connections: Not on file  Intimate Partner Violence: Not on file     Review of Systems    General:  No chills, fever, night sweats or weight changes.  Cardiovascular:  No chest pain, dyspnea on exertion, edema, orthopnea,  palpitations, paroxysmal nocturnal dyspnea. Dermatological: No rash, lesions/masses Respiratory: No cough, dyspnea Urologic: No hematuria, dysuria Abdominal:   No nausea, vomiting, diarrhea, bright red blood per rectum, melena, or hematemesis Neurologic:  No visual changes, wkns, changes in mental status. All other systems reviewed and are otherwise negative except as noted above.  Physical Exam    VS:  BP 112/70   Pulse 84   Ht 6\' 2"  (1.88 m)   Wt 180 lb 3.2 oz (81.7 kg)   SpO2 97%   BMI 23.14 kg/m  , BMI Body mass index is 23.14 kg/m. GEN: Well nourished, well developed, in no acute distress. HEENT: normal. Neck: Supple, no JVD, carotid bruits, or masses. Cardiac: RRR, no murmurs, rubs, or gallops. No clubbing, cyanosis, edema.  Radials/DP/PT 2+ and equal bilaterally.  Respiratory:  Respirations regular and unlabored, clear to auscultation bilaterally. GI: Soft, nontender, nondistended, BS + x 4. MS: no deformity or atrophy. Skin: warm and dry, no rash. Neuro:  Strength and sensation are intact. Psych: Normal affect.  Accessory Clinical Findings    Recent Labs: 08/28/2020: Hemoglobin 14.6; Platelets 349 12/05/2020: ALT 38; BUN 20; Creatinine, Ser 1.10; Potassium 4.7; Sodium 136   Recent Lipid Panel    Component Value Date/Time   CHOL 217 (H) 12/05/2020 1140   TRIG 74 12/05/2020 1140   HDL 58 12/05/2020 1140   CHOLHDL 3.7 12/05/2020 1140   CHOLHDL 4.3 08/27/2020 2118   VLDL 33 08/27/2020 2118   LDLCALC 146 (H) 12/05/2020 1140    ECG personally reviewed by me today-none today.  Echocardiogram 08/28/2020 IMPRESSIONS    1. Left ventricular ejection fraction, by estimation, is 30 to 35%. The  left ventricle has moderately decreased function. The left ventricle  demonstrates regional wall motion abnormalities (see scoring  diagram/findings for description).  Anterior/anteroseptal akinesis. The left ventricular internal cavity size  was mildly dilated. There is  mild left ventricular hypertrophy. Left  ventricular diastolic parameters are indeterminate.  2. Recommend limited echo with contrast to rule out apical thrombus  3. Right ventricular systolic function is normal. The right ventricular  size is normal. Tricuspid regurgitation signal is inadequate for assessing  PA pressure.  4. The mitral valve is normal in structure. Trivial mitral valve  regurgitation.  5. The aortic valve was not well visualized. Aortic valve regurgitation  is not visualized. No aortic stenosis is present.  6. The inferior vena cava is normal in size with <50% respiratory  variability, suggesting right atrial pressure of 8 mmHg.  Cardiac catheterization 08/27/2020  Prox LAD to Mid LAD lesion is 25% stenosed.  1st Diag lesion is 50% stenosed.  Patent LAD stent with mild residual thrombus in the distal portion of the stent with brisk TIMI 3 flow. The initial ECG by EMS showed acute anterior ST elevation suggesting initial total occlusion secondary to thrombus which reperfused in the ER with heparin and NTG resulting in evolutionary T wave inversion prior to coming to the cath lab. The patient had not taken brilinta since his procedure on 08/21/20 resulting in acute stent thrombosis.  Normal ramus, left circumflex and large dominant RCA.  LVEDP 10 mm Hg.  RECOMMENDATION: Continue aggrastat infusion until re-look cath on 08/29/20. Will resume heparin 8 hours post procedure. DAPT with ASA/brilint for at least 12 months. Aggressive lipid lowering with target LDL <70. Once IV NTG is dc'd initiate oral therapy with diagonal disease. 2D-echo in am. Consider post MI ACE-I/ARB and beta blocker as BP and HR allow.   Diagnostic Dominance: Right    Intervention   Assessment & Plan   1.  Acute systolic heart failure-notes improved activity tolerance and less DOE.  Noted to have 30-35% EF on 08/28/2020 echocardiogram.    Continues to slowly increasing his  physical activity.  Previous BMP 12/05/2020 stable with creatinine 1.10. Continue Entresto  Heart healthy low-sodium diet Increase physical activity as tolerated Repeat Echo in one month   STEMI-no chest pain .  Compliant with his medications.  Presented to [facility] on 08/21/2020 and received a stent to his LAD. He left AMA and did not receive Brilinta or aspirin. He called EMS on 08/27/2020 reporting chest pain. He was found to have ST elevation in his anterior leads. He was taken back to the Cath Lab on 08/27/2020 details above. He again left AMA. A prescription for 1 month of Brilinta was sent to his pharmacy.  Continue Plavix 75 mg daily, aspirin, Entresto Heart healthy low-sodium diet-salty 6 given Increase physical activity as tolerated  Hyperlipidemia-08/27/2020: VLDL 33 12/05/2020: Cholesterol, Total 217; HDL 58; LDL Chol Calc (NIH) 146; Triglycerides 74 ContinueCrestor 40 Start ezetimibe 10 mg daily Heart healthy low-sodium high-fiber diet Increase physical activity as tolerated Lipids and lfts in 8 weeks  Palpitations-reports fewer occurrences.   Avoiding caffeine.  Continue to monitor. Avoid triggers caffeine, chocolate, EtOH, dehydration etc.  Disposition: Follow-up with Dr. Darrel Hoover in 3 months.   Thomasene Ripple. Cleaver NP-C    12/23/2020, 11:18 AM [facility] [address] Office [phone] Fax [phone]  Notice: This dictation was prepared with Dragon dictation along with smaller phrase technology. Any transcriptional errors that result from this process are unintentional and may not be corrected upon review.  I spent 10 minutes examining this patient, reviewing medications, and using patient centered shared decision making involving her cardiac care.  Prior to her visit I spent greater than 20 minutes reviewing her past medical history,  medications, and prior cardiac tests.

## 2020-12-23 ENCOUNTER — Ambulatory Visit (INDEPENDENT_AMBULATORY_CARE_PROVIDER_SITE_OTHER): Payer: Self-pay | Admitting: General Practice

## 2020-12-23 ENCOUNTER — Encounter: Payer: Self-pay | Admitting: General Practice

## 2020-12-23 ENCOUNTER — Other Ambulatory Visit: Payer: Self-pay

## 2020-12-23 VITALS — BP 112/70 | HR 84 | Ht 74.0 in | Wt 180.2 lb

## 2020-12-23 DIAGNOSIS — R002 Palpitations: Secondary | ICD-10-CM

## 2020-12-23 DIAGNOSIS — I2102 ST elevation (STEMI) myocardial infarction involving left anterior descending coronary artery: Secondary | ICD-10-CM

## 2020-12-23 DIAGNOSIS — Z79899 Other long term (current) drug therapy: Secondary | ICD-10-CM

## 2020-12-23 DIAGNOSIS — E7800 Pure hypercholesterolemia, unspecified: Secondary | ICD-10-CM

## 2020-12-23 DIAGNOSIS — I5021 Acute systolic (congestive) heart failure: Secondary | ICD-10-CM

## 2020-12-23 MED ORDER — EZETIMIBE 10 MG PO TABS: 10.0000 mg | ORAL_TABLET | Freq: Every day | ORAL | 3 refills | Status: AC

## 2020-12-23 NOTE — Patient Instructions (Signed)
Medication Instructions:  START EZETIMIBE 10MG  DAILY  *If you need a refill on your cardiac medications before your next appointment, please call your pharmacy*  Lab Work: FASTING LIPID PANEL AND LFT IN 8 WEEKS(02-17-2021) If you have labs (blood work) drawn today and your tests are completely normal, you will receive your results only by:  MyChart Message (if you have MyChart) OR A paper copy in the mail.  If you have any lab test that is abnormal or we need to change your treatment, we will call you to review the results. You may go to any [facility] that is convenient for you however, we do have a lab in our office that is able to assist you. You DO NOT need an appointment for our lab. The lab is open 8:00am and closes at 4:00pm. Lunch 12:45 - 1:45pm.  Testing/Procedures: Echocardiogram IN 1 MONTH- Your physician has requested that you have an echocardiogram. Echocardiography is a painless test that uses sound waves to create images of your heart. It provides your doctor with information about the size and shape of your heart and how well your heart's chambers and valves are working. This procedure takes approximately one hour. There are no restrictions for this procedure. This will be performed at our [facility] location - [address], [address].  Follow-Up: Your next appointment:  3 month(s) In Person with [address], MD ONLY-APPT IS SCHEDULED 03-21-2021 AT 11AM  At [facility], you and your health needs are our priority.  As part of our continuing mission to provide you with exceptional heart care, we have created designated Provider Care Teams.  These Care Teams include your primary Cardiologist (physician) and Advanced Practice Providers (APPs -  Physician Assistants and Nurse Practitioners) who all work together to provide you with the care you need, when you need it.

## 2021-01-26 ENCOUNTER — Ambulatory Visit (HOSPITAL_COMMUNITY): Payer: Self-pay | Attending: Cardiology

## 2021-01-26 ENCOUNTER — Other Ambulatory Visit: Payer: Self-pay

## 2021-01-26 DIAGNOSIS — I5021 Acute systolic (congestive) heart failure: Secondary | ICD-10-CM | POA: Insufficient documentation

## 2021-01-26 LAB — ECHOCARDIOGRAM COMPLETE
Area-P 1/2: 3.39 cm2
S' Lateral: 4 cm

## 2021-02-26 ENCOUNTER — Other Ambulatory Visit: Payer: Self-pay | Admitting: General Practice

## 2021-04-09 ENCOUNTER — Ambulatory Visit: Payer: Self-pay | Admitting: Cardiovascular Disease

## 2021-04-10 ENCOUNTER — Encounter: Payer: Self-pay | Admitting: Cardiovascular Disease

## 2021-04-10 ENCOUNTER — Other Ambulatory Visit: Payer: Self-pay

## 2021-04-10 ENCOUNTER — Ambulatory Visit (INDEPENDENT_AMBULATORY_CARE_PROVIDER_SITE_OTHER): Payer: Self-pay | Admitting: Cardiovascular Disease

## 2021-04-10 DIAGNOSIS — I469 Cardiac arrest, cause unspecified: Secondary | ICD-10-CM

## 2021-04-10 DIAGNOSIS — Z720 Tobacco use: Secondary | ICD-10-CM

## 2021-04-10 DIAGNOSIS — E7800 Pure hypercholesterolemia, unspecified: Secondary | ICD-10-CM

## 2021-04-10 DIAGNOSIS — I2102 ST elevation (STEMI) myocardial infarction involving left anterior descending coronary artery: Secondary | ICD-10-CM

## 2021-04-10 DIAGNOSIS — I255 Ischemic cardiomyopathy: Secondary | ICD-10-CM

## 2021-04-10 DIAGNOSIS — I5042 Chronic combined systolic (congestive) and diastolic (congestive) heart failure: Secondary | ICD-10-CM

## 2021-04-10 DIAGNOSIS — Z79899 Other long term (current) drug therapy: Secondary | ICD-10-CM

## 2021-04-10 DIAGNOSIS — R002 Palpitations: Secondary | ICD-10-CM

## 2021-04-10 MED ORDER — CARVEDILOL 3.125 MG PO TABS: 3.1250 mg | ORAL_TABLET | Freq: Two times a day (BID) | ORAL | 3 refills | Status: DC

## 2021-04-10 NOTE — Progress Notes (Signed)
Cardiology Office Note    Date:  04/13/2021   ID:  Francisco Stewart, DOB [birth year], MRN [id number]  PCP:  Pcp, No  Cardiologist:  Shelva Majestic, MD   Initial office visit cardiology evaluation with me  History of Present Illness:  Francisco Stewart is a 45 y.o. male who suffered an initial myocardial infarction apparently in 2011.  On August 21, 2020 he apparently suffered a VF cardiac arrest requiring defibrillation and CPR and underwent cardiac catheterization at [facility] with ultimate stenting of his LAD.  His procedure was done through the groin.  Apparently he developed recurrent chest pain symptoms later today and was taken back to the catheterization laboratory and underwent repeat catheterization through the right radial artery approach.  Apparently the patient became very confused and ultimately signed out [place].  As result he was not on antiplatelet therapy since his initial stent and had been taking aspirin.  1 week later, he developed severe 10 out of 10 chest pain, called EMS, a code STEMI was activated and he presented with 2 to 3 mm of anterolateral ST elevation on ECG.  He presented to [facility] was given IV heparin and IV nitroglycerin and his chest pain subsided.  ECG showed resolution of his ST elevation but evolutionary deeply inverted T waves suggesting reperfusion.  He was taken acutely to the cardiac catheterization laboratory by me on August 27, 2020.  His LAD stent was patent with mild residual thrombus in the distal portion of the stent with brisk TIMI-3 flow.  He had a normal ramus, left circumflex, and large dominant RCA.  At that time, I initiated Aggrastat infusion and recommended restudy 2 days later and continue aspirin/Brilinta for minimum of 12 months as well as aggressive lipid-lowering therapy.  An echo Doppler study on August 28, 2020 revealed an EF of 30 to 35% with anterior and anteroseptal akinesis, mild LVH, mild LV dilation.   Apparently, again the patient signed out AMA but we were able to contact him and arrange for him to get antiplatelet therapy following his discharge.  He was subsequently seen in follow-up on September 03, 2020 by [name].  At that time, he had told [name] that he had been on Plavix in 2011 for 6 months.  At his follow-up evaluation he had been compliant with his Brilinta.  At that time the decision was to continue Brilinta for 1 month and then transition to Plavix as well as initiate lipid-lowering therapy.  He was started on rosuvastatin and agreed to reduce his smoking.  He was seen in follow-up by [name] on December 04, 2020 and was feeling well.  He was using an exercise bike.  He was started on Entresto 24/26 and laboratory was repeated.  He was seen by [name] on December 23, 2020 in follow-up.  He underwent an echo Doppler study on January 26, 2021 which showed an EF of 45 to 50% with grade 1 diastolic dysfunction.  Presently, patient admits to mild shortness of breath with high intensity exercise.  He typically rides a bike up to 15 miles.  He denies dizziness.  He denies chest tightness.  He does admit to some intermittent palpitations.  He works with the [employer].  He has continued to smoke cigarettes.  He denies any presyncope or syncope.  He is not had recent laboratory.  He presents for initial office visit with me.   Past Medical History:  Diagnosis Date   Anxiety  UDS 08/21/2020 +THC   History of placement of stent in LAD coronary artery 08/21/2020   Smoker     Past Surgical History:  Procedure Laterality Date   CORONARY ANGIOPLASTY WITH STENT PLACEMENT  08/21/2020   stent x1 to LAD at [place]  2011   in [place] per pt, stents to LAD   LEFT HEART CATH AND CORONARY ANGIOGRAPHY N/A 08/27/2020   Procedure: LEFT HEART CATH AND CORONARY ANGIOGRAPHY;  Surgeon: [name], MD;  Location: [facility];  Service: Cardiovascular;  Laterality: N/A;    Current Medications: Outpatient Medications Prior to Visit  Medication Sig Dispense Refill   aspirin EC 81 MG tablet Take 81 mg by mouth daily. Swallow whole.     clopidogrel (PLAVIX) 75 MG tablet TAKE 8 TABLETS BY MOUTH AS A ONE TIME DOSE THEN TAKE 1 TABLET ONCE DAILY 38 tablet 0   ezetimibe (ZETIA) 10 MG tablet Take 1 tablet (10 mg total) by mouth daily. 30 tablet 3   rosuvastatin (CRESTOR) 40 MG tablet Take 1 tablet (40 mg total) by mouth daily. 30 tablet 6   sacubitril-valsartan (ENTRESTO) 24-26 MG TAKE 1 TABLET BY MOUTH 2 (TWO) TIMES DAILY. 60 tablet 6   No facility-administered medications prior to visit.     Allergies:   Patient has no known allergies.   Social History   Socioeconomic History   Marital status: Single    Spouse name: Not on file   Number of children: Not on file   Years of education: Not on file   Highest education level: Not on file  Occupational History   Not on file  Tobacco Use   Smoking status: Every Day    Pack years: 0.00    Types: Cigarettes   Smokeless tobacco: Never  Substance and Sexual Activity   Alcohol use: Not on file   Drug use: Not on file   Sexual activity: Not on file  Other Topics Concern   Not on file  Social History Narrative   Not on file   Social Determinants of Health   Financial Resource Strain: Not on file  Food Insecurity: Not on file  Transportation Needs: Not on file  Physical Activity: Not on file  Stress: Not on file  Social Connections: Not on file    Socially he is single.  He has been smoking cigarettes since his early 68s.  He is self-employed in the [place] History:  The patient's family history is not on file.   ROS General: Negative; No fevers, chills, or night sweats;  HEENT: Negative; No changes in vision or hearing, sinus congestion, difficulty swallowing Pulmonary: Negative; No cough, wheezing, shortness of breath,  hemoptysis Cardiovascular: Negative; No chest pain, presyncope, syncope, palpitations GI: Negative; No nausea, vomiting, diarrhea, or abdominal pain GU: Negative; No dysuria, hematuria, or difficulty voiding Musculoskeletal: Negative; no myalgias, joint pain, or weakness Hematologic/Oncology: Negative; no easy bruising, bleeding Endocrine: Negative; no heat/cold intolerance; no diabetes Neuro: Negative; no changes in balance, headaches Skin: Negative; No rashes or skin lesions Psychiatric: Negative; No behavioral problems, depression Sleep: Negative; No snoring, daytime sleepiness, hypersomnolence, bruxism, restless legs, hypnogognic hallucinations, no cataplexy Other comprehensive 14 point system review is negative.   PHYSICAL EXAM:   VS:  BP 102/78   Pulse 68   Ht 6' 2"  (1.88 m)   Wt 181 lb 9.6 oz (82.4 kg)   SpO2 97%   BMI  23.32 kg/m     Repeat blood pressure by me 104/78  Wt Readings from Last 3 Encounters:  04/10/21 181 lb 9.6 oz (82.4 kg)  12/23/20 180 lb 3.2 oz (81.7 kg)  11/24/20 178 lb 3.2 oz (80.8 kg)    General: Alert, oriented, no distress.  Skin: normal turgor, no rashes, warm and dry HEENT: Normocephalic, atraumatic. Pupils equal round and reactive to light; sclera anicteric; extraocular muscles intact;  Nose without nasal septal hypertrophy Mouth/Parynx benign; Mallinpatti scale 3 Neck: No JVD, no carotid bruits; normal carotid upstroke Lungs: clear to ausculatation and percussion; no wheezing or rales Chest wall: without tenderness to palpitation Heart: PMI not displaced, RRR, s1 s2 normal, 1/6 systolic murmur, no diastolic murmur, no rubs, gallops, thrills, or heaves Abdomen: soft, nontender; no hepatosplenomehaly, BS+; abdominal aorta nontender and not dilated by palpation. Back: no CVA tenderness Pulses 2+ Musculoskeletal: full range of motion, normal strength, no joint deformities Extremities: no clubbing cyanosis or edema, Homan's sign negative   Neurologic: grossly nonfocal; Cranial nerves grossly wnl Psychologic: Normal mood and affect   Studies/Labs Reviewed:   EKG:  EKG is ordered today.  ECG (independently read by me):  NSR at 68; QS V1-3; no ectopy  Recent Labs: BMP Latest Ref Rng & Units 12/05/2020 11/24/2020 08/28/2020  Glucose 65 - 99 mg/dL 75 83 92  BUN 6 - 24 mg/dL 20 17 31(H)  Creatinine 0.76 - 1.27 mg/dL 1.10 1.08 0.91  BUN/Creat Ratio 9 - 20 18 16  -  Sodium 134 - 144 mmol/L 136 143 137  Potassium 3.5 - 5.2 mmol/L 4.7 5.5(H) 3.9  Chloride 96 - 106 mmol/L 98 103 106  CO2 20 - 29 mmol/L 23 25 21(L)  Calcium 8.7 - 10.2 mg/dL 9.4 9.9 8.8(L)     Hepatic Function Latest Ref Rng & Units 12/05/2020 09/25/2020 08/27/2020  Total Protein 6.0 - 8.5 g/dL 7.1 6.4 6.6  Albumin 4.0 - 5.0 g/dL 4.6 4.4 3.5  AST 0 - 40 IU/L 39 45(H) 174(H)  ALT 0 - 44 IU/L 38 42 147(H)  Alk Phosphatase 44 - 121 IU/L 72 75 59  Total Bilirubin 0.0 - 1.2 mg/dL 0.9 1.0 0.8  Bilirubin, Direct 0.00 - 0.40 mg/dL 0.21 0.23 -    CBC Latest Ref Rng & Units 08/28/2020 08/27/2020 08/27/2020  WBC 4.0 - 10.5 K/uL 10.3 - 12.8(H)  Hemoglobin 13.0 - 17.0 g/dL 14.6 13.6 15.6  Hematocrit 39.0 - 52.0 % 41.9 40.0 45.9  Platelets 150 - 400 K/uL 349 - 375   Lab Results  Component Value Date   MCV 89.1 08/28/2020   MCV 90.9 08/27/2020   MCV 85.0 10/25/2010   No results found for: TSH Lab Results  Component Value Date   HGBA1C 5.5 08/27/2020     BNP No results found for: BNP  ProBNP No results found for: PROBNP   Lipid Panel     Component Value Date/Time   CHOL 217 (H) 12/05/2020 1140   TRIG 74 12/05/2020 1140   HDL 58 12/05/2020 1140   CHOLHDL 3.7 12/05/2020 1140   CHOLHDL 4.3 08/27/2020 2118   VLDL 33 08/27/2020 2118   LDLCALC 146 (H) 12/05/2020 1140   LABVLDL 13 12/05/2020 1140     RADIOLOGY: No results found.   Additional studies/ records that were reviewed today include:    Prox LAD to Mid LAD lesion is 25% stenosed. 1st  Diag lesion is 50% stenosed.   Patent LAD stent with mild residual thrombus in the distal  portion of the stent with brisk TIMI 3 flow.  The initial ECG by EMS showed acute anterior ST elevation suggesting initial total occlusion secondary to thrombus which reperfused in the ER with heparin and NTG resulting in evolutionary T wave inversion prior to coming to the cath lab. The patient had not taken brilinta since his procedure on 08/21/20 resulting in acute stent thrombosis.   Normal ramus, left circumflex and large dominant RCA.   LVEDP 10 mm Hg.   RECOMMENDATION: Continue aggrastat infusion until re-look cath on 08/29/20. Will resume heparin 8 hours post procedure. DAPT with ASA/brilint for at least 12 months. Aggressive lipid lowering with target LDL <70. Once IV NTG is dc'd initiate oral therapy with diagonal disease.  2D-echo in am. Consider post MI ACE-I/ARB and beta blocker as BP and HR allow.             ECHO 08/27/2020 IMPRESSIONS   1. Left ventricular ejection fraction, by estimation, is 30 to 35%. The  left ventricle has moderately decreased function. The left ventricle  demonstrates regional wall motion abnormalities (see scoring  diagram/findings for description).  Anterior/anteroseptal akinesis. The left ventricular internal cavity size  was mildly dilated. There is mild left ventricular hypertrophy. Left  ventricular diastolic parameters are indeterminate.   2. Recommend limited echo with contrast to rule out apical thrombus   3. Right ventricular systolic function is normal. The right ventricular  size is normal. Tricuspid regurgitation signal is inadequate for assessing  PA pressure.   4. The mitral valve is normal in structure. Trivial mitral valve  regurgitation.   5. The aortic valve was not well visualized. Aortic valve regurgitation  is not visualized. No aortic stenosis is present.   6. The inferior vena cava is normal in size with <50% respiratory   variability, suggesting right atrial pressure of 8 mmHg.   ASSESSMENT:    1. Cardiac arrest [facility]): VF 08/21/2020   2. ST elevation myocardial infarction involving left anterior descending (LAD) coronary artery ([place])   3. Ischemic cardiomyopathy   4. Chronic combined systolic and diastolic heart failure (HCC)   5. Palpitations   6. Pure hypercholesterolemia   7. Tobacco abuse   8. Medication management     PLAN:  Mr. Marchello Rothgeb is a 45 year old gentleman who started smoking in his early 67s.  In 2011, at age 45 he suffered myocardial and March and and apparently underwent insertion of 2 stents at that time.  Following his procedure he had been on Plavix for approximately 6 months.  The patient developed a VF cardiac arrest requiring defibrillation in the setting of anterior MI on August 21, 2020.  He underwent intervention but unfortunately he signed out AMA following his stent was off DAPT until he presented 1 week later with another acute coronary syndrome secondary to stent thrombosis.  During that procedure he was started on Aggrastat with reperfusion with plans for relook catheterization but unfortunately the patient again signed out AMA.  Fortunately we were able to provide him with DAPT for minimum of 1 month following his last AMA departure and he subsequently has been evaluated on several occasions in our office by [name], NP.  I have reviewed all his evaluations.  Presently, he has been on a medical regimen consisting of aspirin/Plavix for DAPT, Zetia 10 mg and rosuvastatin 40 mg for aggressive hyperlipidemia, and had been started on Entresto 24/26 mg twice daily with his ischemic cardiomyopathy and initial echo showed an EF of 30 to  35%.  His subsequent echo Doppler study in April 2022 was reviewed which has shown some improvement in LV function with EF now at 45 to 50%.  There is grade 1 diastolic dysfunction.  There was improvement in his previous wall motion but he still had  residual septal hypokinesis.  Presently, he is without anginal symptomatology.  Lipid studies in February 2022 showed an LDL cholesterol of 146, total cholesterol 217 and triglycerides of 74.  His resting pulse is 68.  He admits to occasional palpitations.  I am electing to initiate low-dose carvedilol 3.125 mg twice a day with his residual ischemic cardiomyopathy and his known CAD.  I discussed the importance of complete smoking cessation.  I am recommending he undergo fasting laboratory including a comprehensive metabolic panel, CBC, proBNP, lipid studies.  In the future we will try to slowly titrate Entresto and potentially add aldosterone blockade depending upon LV function and blood pressure.  I will see him in 3 months for reevaluation or sooner as needed.  Medication Adjustments/Labs and Tests Ordered: Current medicines are reviewed at length with the patient today.  Concerns regarding medicines are outlined above.  Medication changes, Labs and Tests ordered today are listed in the Patient Instructions below. Patient Instructions  Medication Instructions:  BEGIN carvedilol 3.127m (1 tablet) twice daily.   *If you need a refill on your cardiac medications before your next appointment, please call your pharmacy*   Lab Work: FASTING: Cmet, CBC, Lipid, ProBNP, TSH.  If you have labs (blood work) drawn today and your tests are completely normal, you will receive your results only by: [place](if you have MyChart) OR A paper copy in the mail If you have any lab test that is abnormal or we need to change your treatment, we will call you to review the results.   Testing/Procedures: None ordered.    Follow-Up: [facility] and your [facility] needs are our priority.  As part of our continuing mission to provide you with exceptional heart care, we have created designated Provider Care Teams.  These Care Teams include your primary Cardiologist (physician) and Advanced Practice  Providers (APPs -  Physician Assistants and Nurse Practitioners) who all work together to provide you with the care you need, when you need it.  We recommend signing up for the patient portal called "MyChart".  Sign up information is provided on this After Visit Summary.  MyChart is used to connect with patients for Virtual Visits (Telemedicine).  Patients are able to view lab/test results, encounter notes, upcoming appointments, etc.  Non-urgent messages can be sent to your provider as well.   To learn more about what you can do with MyChart, go to [email or web address]    Your next appointment:   3 month(s)  The format for your next appointment:   In Person  Provider:   TShelva Majestic MD     Signed, TShelva Majestic MD  04/13/2021 [address] [place]  [id number]: [phone]

## 2021-04-10 NOTE — Patient Instructions (Signed)
Medication Instructions:  BEGIN carvedilol 3.125mg  (1 tablet) twice daily.   *If you need a refill on your cardiac medications before your next appointment, please call your pharmacy*   Lab Work: FASTING: Cmet, CBC, Lipid, ProBNP, TSH.  If you have labs (blood work) drawn today and your tests are completely normal, you will receive your results only by: MyChart Message (if you have MyChart) OR A paper copy in the mail If you have any lab test that is abnormal or we need to change your treatment, we will call you to review the results.   Testing/Procedures: None ordered.    Follow-Up: At [facility], you and your health needs are our priority.  As part of our continuing mission to provide you with exceptional heart care, we have created designated Provider Care Teams.  These Care Teams include your primary Cardiologist (physician) and Advanced Practice Providers (APPs -  Physician Assistants and Nurse Practitioners) who all work together to provide you with the care you need, when you need it.  We recommend signing up for the patient portal called "MyChart".  Sign up information is provided on this After Visit Summary.  MyChart is used to connect with patients for Virtual Visits (Telemedicine).  Patients are able to view lab/test results, encounter notes, upcoming appointments, etc.  Non-urgent messages can be sent to your provider as well.   To learn more about what you can do with MyChart, go to [email or web address].    Your next appointment:   3 month(s)  The format for your next appointment:   In Person  Provider:   Nicki Gu[place], MD

## 2021-04-13 ENCOUNTER — Encounter: Payer: Self-pay | Admitting: Cardiovascular Disease

## 2021-04-15 ENCOUNTER — Other Ambulatory Visit: Payer: Self-pay | Admitting: Cardiovascular Disease

## 2021-07-14 NOTE — Progress Notes (Deleted)
Cardiology Clinic Note   Patient Name: Francisco Stewart Date of Encounter: 07/14/2021  Primary Care Provider:  Oneita Hurt, No Primary Cardiologist:  Nicki Guadalajara, MD  Patient Profile    Francisco Stewart 45 year old male presents to the clinic today for follow-up evaluation of his coronary artery disease status post STEMI 08/21/20 and management of systolic heart failure.  Past Medical History    Past Medical History:  Diagnosis Date   Anxiety    UDS 08/21/2020 +THC   History of placement of stent in LAD coronary artery 08/21/2020   Smoker    Past Surgical History:  Procedure Laterality Date   CORONARY ANGIOPLASTY WITH STENT PLACEMENT  08/21/2020   stent x1 to LAD at [facility]   CORONARY ANGIOPLASTY WITH STENT PLACEMENT  2011   in [place] per pt, stents to LAD   LEFT HEART CATH AND CORONARY ANGIOGRAPHY N/A 08/27/2020   Procedure: LEFT HEART CATH AND CORONARY ANGIOGRAPHY;  Surgeon: Lennette Bihari, MD;  Location: [facility] INVASIVE CV LAB;  Service: Cardiovascular;  Laterality: N/A;    Allergies  No Known Allergies  History of Present Illness    Mr. Maffei is a PMH of coronary artery disease, status post anterior STEMI 08/21/2020.  His PMH also includes systolic heart failure with an LVEF of 30-35%.   He was admitted at [facility] 08/21/2020 after presenting with anterior STEMI.  He previously had PCI to his LAD.  He was noted to have V. fib arrest x3 requiring multiple DCCV's prior to making it to the Cath Lab.  He was intubated and underwent cardiac catheterization receiving PCI to his LAD.  He developed recurrent chest pain with a residual ST elevation and was taken back to the Cath Lab.  No thrombus or residual significant lesions were noted.  With review of documentation his discharge was unclear.  Patient indicated that he just decided to leave.  He reported that he had to be escorted out of the hospital with security.  He did not have his Brilinta  or prescriptions and therefore was not taking DAPT since his PCI 08/21/2020.  He reported intermittent chest discomfort since that time however, it became more severe, similar to the symptoms he had with his STEMI, so he contacted EMS.  He was found to have recurrent ST elevations in his anterior leads.   He underwent repeat cardiac catheterization by Dr. Tresa Endo on 08/27/2020.  It showed proximal LAD-mid LAD lesion 25% stenosed, first diagonal lesion 50% stenosed.  His LAD stent was patent with mild residual thrombus in the distal portion of the stent.  It was felt that his initial EKG by EMS showed acute anterior STEMI which was suggestive of initial total occlusion secondary to thrombus which reperfused in the ER with heparin and nitroglycerin resulting in an evolutionary T wave perfusion prior to coming to the Cath Lab.  He was continued on his Aggrastat infusion until relook cath on 08/29/2020.  Plan was to resume heparin 8 hours post procedure.  Placed him on DAPT aspirin/Brilinta for 12 months, repeat echocardiogram, and start ACE/ARB and beta-blocker.   He again left the hospital AMA.  His case was discussed and it was recommended that he continue Brilinta x1 month and then transition to Plavix.  A prescription for Brilinta was sent to his pharmacy.  Order for Plavix was not placed.   He presented the clinic 09/03/20 for follow-up evaluation stated he has been compliant with his Brilinta.  He had been  working with hospital social work to work out [employer] for his hospital bill.  He had previously been on Plavix for around 6 months in 2011.  He underwent cardiac catheterization at that time and took full-strength aspirin along with Plavix until he developed significant bruising then stop the medication.  He felt he was recovering much faster than he did when he was 35 during his first MI.  He was lost to follow-up after that time.  He stated that he had greatly changed his diet and remained  physically active.  He was agreeable with the plan of transitioning from Brilinta to Plavix in 3 weeks.  I also discussed starting statin medication due to his elevated cholesterol.  He did not wish to start Lipitor but was willing to take rosuvastatin.  He had also greatly reduced his smoking.  He had some right forearm bruising at his cath site.  I  gave him salty 6 diet sheet, start him on rosuvastatin 40, planned transition him to Plavix in 3 weeks, and see him back for follow-up appointment in 3 weeks.  We will repeat his fasting lipids and liver panel in 8 weeks.   He presented to the clinic 12/04/2020 for follow-up evaluation states he was feeling fairly well.  He reported that he had been using his stationary bike.  However, in the last month he had been less consistent with his exercise.  He had been maintaining a Mediterranean style low-salt diet.  He reported intermittent episodes of brief palpitations.  He had tried to increase his hydration and cut out caffeine which he noted  made a difference in the frequency of his palpitations.  He reported compliance with his medications and no side effects.  We  started him on Entresto 24/26, ordered a BMP in a week, repeated his fasting lipids and liver.  We plan to have him increase his physical activity as tolerated and follow-up in 1 month.   He presented the clinic 12/23/2020 for follow-up evaluation and stated he  had some blood pressures in the 90s over 70s.  He reported he continued to try to stay physically active.  He reported that his palpitations felt less strong after starting Entresto.  We discussed his cholesterol panel and he reported compliance with his rosuvastatin and high-fiber diet.  I  continued his Entresto at the current dose and scheduled an echocardiogram for 1 month.  We prescribed ezetimibe 10 mg daily and planned to repeat his fasting lipids and liver in 8 weeks.  Follow-up was planned for 3 months with Dr. Tresa Endo.  His repeat  echocardiogram 01/26/2021 showed an EF of 45-50% with G1 DD.  He was seen by Dr. Tresa Endo 04/10/2021.  During that time he reported some mild shortness of breath with high intensity exercise.  He was riding his bike up to 15 miles.  He denied dizziness.  He denied chest tightness.  He continued to have intermittent periods of palpitations.  He was working with a [employer].  He continued to smoke cigarettes.  He denied presyncope and syncope.  Carvedilol 3.125 mg was added to his medication regimen.  Smoking cessation was recommended.  Repeat fasting lab work was recommended along with CBC, proBNP, and CMP.  Continuing to increase his Entresto if tolerated was discussed.  Follow-up was planned for 3 months.  He presents to the clinic today for follow-up evaluation states***   Today he denies chest pain, shortness of breath, lower extremity edema, fatigue, palpitations, melena, hematuria, hemoptysis,  diaphoresis, weakness, presyncope, syncope, orthopnea, and PND.  Home Medications    Prior to Admission medications   Medication Sig Start Date End Date Taking? Authorizing Provider  aspirin EC 81 MG tablet Take 81 mg by mouth daily. Swallow whole.    [provider]  carvedilol (COREG) 3.125 MG tablet Take 1 tablet (3.125 mg total) by mouth 2 (two) times daily with a meal. 04/10/21   Lennette Bihari, MD  clopidogrel (PLAVIX) 75 MG tablet TAKE 8 TABLETS BY MOUTH ONCE AS A ONE TIME DOSE THEN TAKE 1 TABLET ONCE DAILY 04/16/21   Lennette Bihari, MD  ezetimibe (ZETIA) 10 MG tablet Take 1 tablet (10 mg total) by mouth daily. 12/23/20 04/10/21  Ronney Asters, NP  rosuvastatin (CRESTOR) 40 MG tablet Take 1 tablet (40 mg total) by mouth daily. 09/03/20 04/10/21  Ronney Asters, NP  sacubitril-valsartan (ENTRESTO) 24-26 MG TAKE 1 TABLET BY MOUTH 2 (TWO) TIMES DAILY. 11/24/20 11/24/21  Ronney Asters, NP    Family History    No family history on file. has no family status information on file.   Social  History    Social History   Socioeconomic History   Marital status: Single    Spouse name: Not on file   Number of children: Not on file   Years of education: Not on file   Highest education level: Not on file  Occupational History   Not on file  Tobacco Use   Smoking status: Every Day    Types: Cigarettes   Smokeless tobacco: Never  Substance and Sexual Activity   Alcohol use: Not on file   Drug use: Not on file   Sexual activity: Not on file  Other Topics Concern   Not on file  Social History Narrative   Not on file   Social Determinants of Health   Financial Resource Strain: Not on file  Food Insecurity: Not on file  Transportation Needs: Not on file  Physical Activity: Not on file  Stress: Not on file  Social Connections: Not on file  Intimate Partner Violence: Not on file     Review of Systems    General:  No chills, fever, night sweats or weight changes.  Cardiovascular:  No chest pain, dyspnea on exertion, edema, orthopnea, palpitations, paroxysmal nocturnal dyspnea. Dermatological: No rash, lesions/masses Respiratory: No cough, dyspnea Urologic: No hematuria, dysuria Abdominal:   No nausea, vomiting, diarrhea, bright red blood per rectum, melena, or hematemesis Neurologic:  No visual changes, wkns, changes in mental status. All other systems reviewed and are otherwise negative except as noted above.  Physical Exam    VS:  There were no vitals taken for this visit. , BMI There is no height or weight on file to calculate BMI. GEN: Well nourished, well developed, in no acute distress. HEENT: normal. Neck: Supple, no JVD, carotid bruits, or masses. Cardiac: RRR, no murmurs, rubs, or gallops. No clubbing, cyanosis, edema.  Radials/DP/PT 2+ and equal bilaterally.  Respiratory:  Respirations regular and unlabored, clear to auscultation bilaterally. GI: Soft, nontender, nondistended, BS + x 4. MS: no deformity or atrophy. Skin: warm and dry, no rash. Neuro:   Strength and sensation are intact. Psych: Normal affect.  Accessory Clinical Findings    Recent Labs: 08/28/2020: Hemoglobin 14.6; Platelets 349 12/05/2020: ALT 38; BUN 20; Creatinine, Ser 1.10; Potassium 4.7; Sodium 136   Recent Lipid Panel    Component Value Date/Time   CHOL 217 (H) 12/05/2020 1140   TRIG  74 12/05/2020 1140   HDL 58 12/05/2020 1140   CHOLHDL 3.7 12/05/2020 1140   CHOLHDL 4.3 08/27/2020 2118   VLDL 33 08/27/2020 2118   LDLCALC 146 (H) 12/05/2020 1140    ECG personally reviewed by me today- *** - No acute changes  Echocardiogram 08/28/2020 IMPRESSIONS     1. Left ventricular ejection fraction, by estimation, is 30 to 35%. The  left ventricle has moderately decreased function. The left ventricle  demonstrates regional wall motion abnormalities (see scoring  diagram/findings for description).  Anterior/anteroseptal akinesis. The left ventricular internal cavity size  was mildly dilated. There is mild left ventricular hypertrophy. Left  ventricular diastolic parameters are indeterminate.   2. Recommend limited echo with contrast to rule out apical thrombus   3. Right ventricular systolic function is normal. The right ventricular  size is normal. Tricuspid regurgitation signal is inadequate for assessing  PA pressure.   4. The mitral valve is normal in structure. Trivial mitral valve  regurgitation.   5. The aortic valve was not well visualized. Aortic valve regurgitation  is not visualized. No aortic stenosis is present.   6. The inferior vena cava is normal in size with <50% respiratory  variability, suggesting right atrial pressure of 8 mmHg.   Cardiac catheterization 08/27/2020 Prox LAD to Mid LAD lesion is 25% stenosed. 1st Diag lesion is 50% stenosed.   Patent LAD stent with mild residual thrombus in the distal portion of the stent with brisk TIMI 3 flow.  The initial ECG by EMS showed acute anterior ST elevation suggesting initial total occlusion  secondary to thrombus which reperfused in the ER with heparin and NTG resulting in evolutionary T wave inversion prior to coming to the cath lab. The patient had not taken brilinta since his procedure on 08/21/20 resulting in acute stent thrombosis.   Normal ramus, left circumflex and large dominant RCA.   LVEDP 10 mm Hg.   RECOMMENDATION: Continue aggrastat infusion until re-look cath on 08/29/20. Will resume heparin 8 hours post procedure. DAPT with ASA/brilint for at least 12 months. Aggressive lipid lowering with target LDL <70. Once IV NTG is dc'd initiate oral therapy with diagonal disease.  2D-echo in am. Consider post MI ACE-I/ARB and beta blocker as BP and HR allow.    Diagnostic Dominance: Right     Intervention    Echocardiogram 01/26/2021 IMPRESSIONS     1. Left ventricular ejection fraction, by estimation, is 45 to 50%. The  left ventricle has mildly decreased function. The left ventricle  demonstrates regional wall motion abnormalities with septal hypokinesis.  Left ventricular diastolic parameters are  consistent with Grade I diastolic dysfunction (impaired relaxation).   2. Right ventricular systolic function is normal. The right ventricular  size is normal. Tricuspid regurgitation signal is inadequate for assessing  PA pressure.   3. The mitral valve is normal in structure. No evidence of mitral valve  regurgitation. No evidence of mitral stenosis.   4. The aortic valve is tricuspid. Aortic valve regurgitation is not  visualized. No aortic stenosis is present.   5. The inferior vena cava is normal in size with greater than 50%  respiratory variability, suggesting right atrial pressure of 3 mmHg.   6. EF improved from prior.  Assessment & Plan   1.  Acute systolic heart failure-notes improved activity tolerance and less DOE.  Noted to have 30-35% EF on 08/28/2020 echocardiogram.  Echocardiogram 01/26/2021 showed an EF of 45-50% with G1 DD.  Remains very physically  active.  Continue Entresto, carvedilol Heart healthy low-sodium diet Increase physical activity as tolerated Order CMP, CBC     STEMI-denies recent episodes of arm neck back or chest discomfort.  Reports compliance with his medications.  Presented to [facility] hospital on 08/21/2020 and received a stent to his LAD.  He left AMA and did not receive Brilinta or aspirin.  He called EMS on 08/27/2020 reporting chest pain.  He was found to have ST elevation in his anterior leads.  He was taken back to the Cath Lab on 08/27/2020 details above.  He again left AMA.  A prescription for 1 month of Brilinta was sent to his pharmacy.   Continue Plavix 75 mg daily, aspirin, Entresto, carvedilol Heart healthy low-sodium diet-salty 6 given Maintain physical activity as tolerated   Hyperlipidemia-08/27/2020: VLDL 33 12/05/2020: Cholesterol, Total 217; HDL 58; LDL Chol Calc (NIH) 146; Triglycerides 74 Continue Crestor 40, ezetimibe 10 mg daily Heart healthy low-sodium high-fiber diet Maintain physical activity as tolerated Repeat fasting lipids and lfts    Palpitations-no recent episodes.  Continues to avoid caffeine.  Avoid triggers caffeine, chocolate, EtOH, dehydration etc. Continue carvedilol Maintain physical activity  Disposition: Follow-up with Dr. Tresa Endo or  me in 3-4 months.   Thomasene Ripple. Louine Tenpenny NP-C    07/14/2021, 9:12 AM [facility] [address] Office [phone] Fax [phone]  Notice: This dictation was prepared with Dragon dictation along with smaller phrase technology. Any transcriptional errors that result from this process are unintentional and may not be corrected upon review.  I spent***minutes examining this patient, reviewing medications, and using patient centered shared decision making involving her cardiac care.  Prior to her visit I spent greater than 20 minutes reviewing her past medical history,  medications, and prior  cardiac tests.

## 2021-07-17 ENCOUNTER — Ambulatory Visit: Payer: Self-pay | Admitting: General Practice

## 2021-07-21 IMAGING — DX DG CHEST 1V PORT
1 series · 2 of 2 positions shown · non-contrast
Comparison: None.  August 21, 2020

CLINICAL DATA: Chest pain

EXAM:
PORTABLE CHEST 1 VIEW

[Series 1: chest · 0.14mm/px · 2 of 2 slices shown]
[im 1/2]
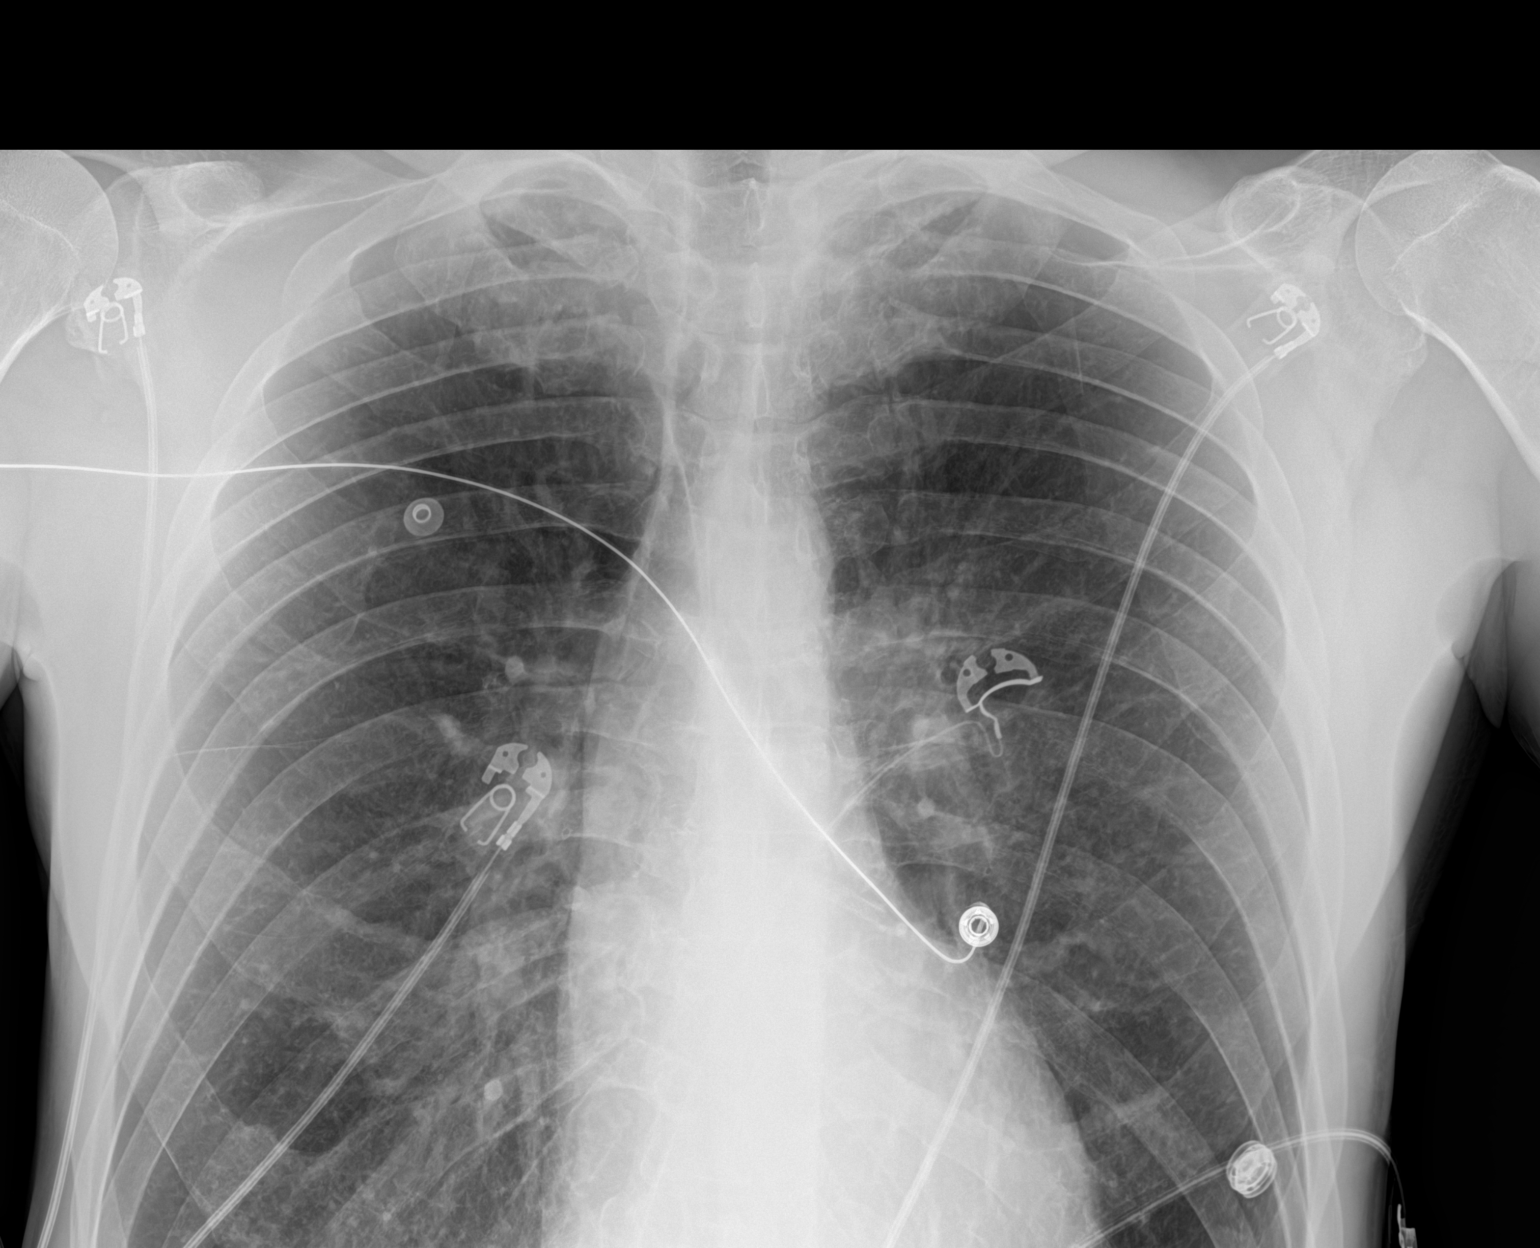
[im 2/2]
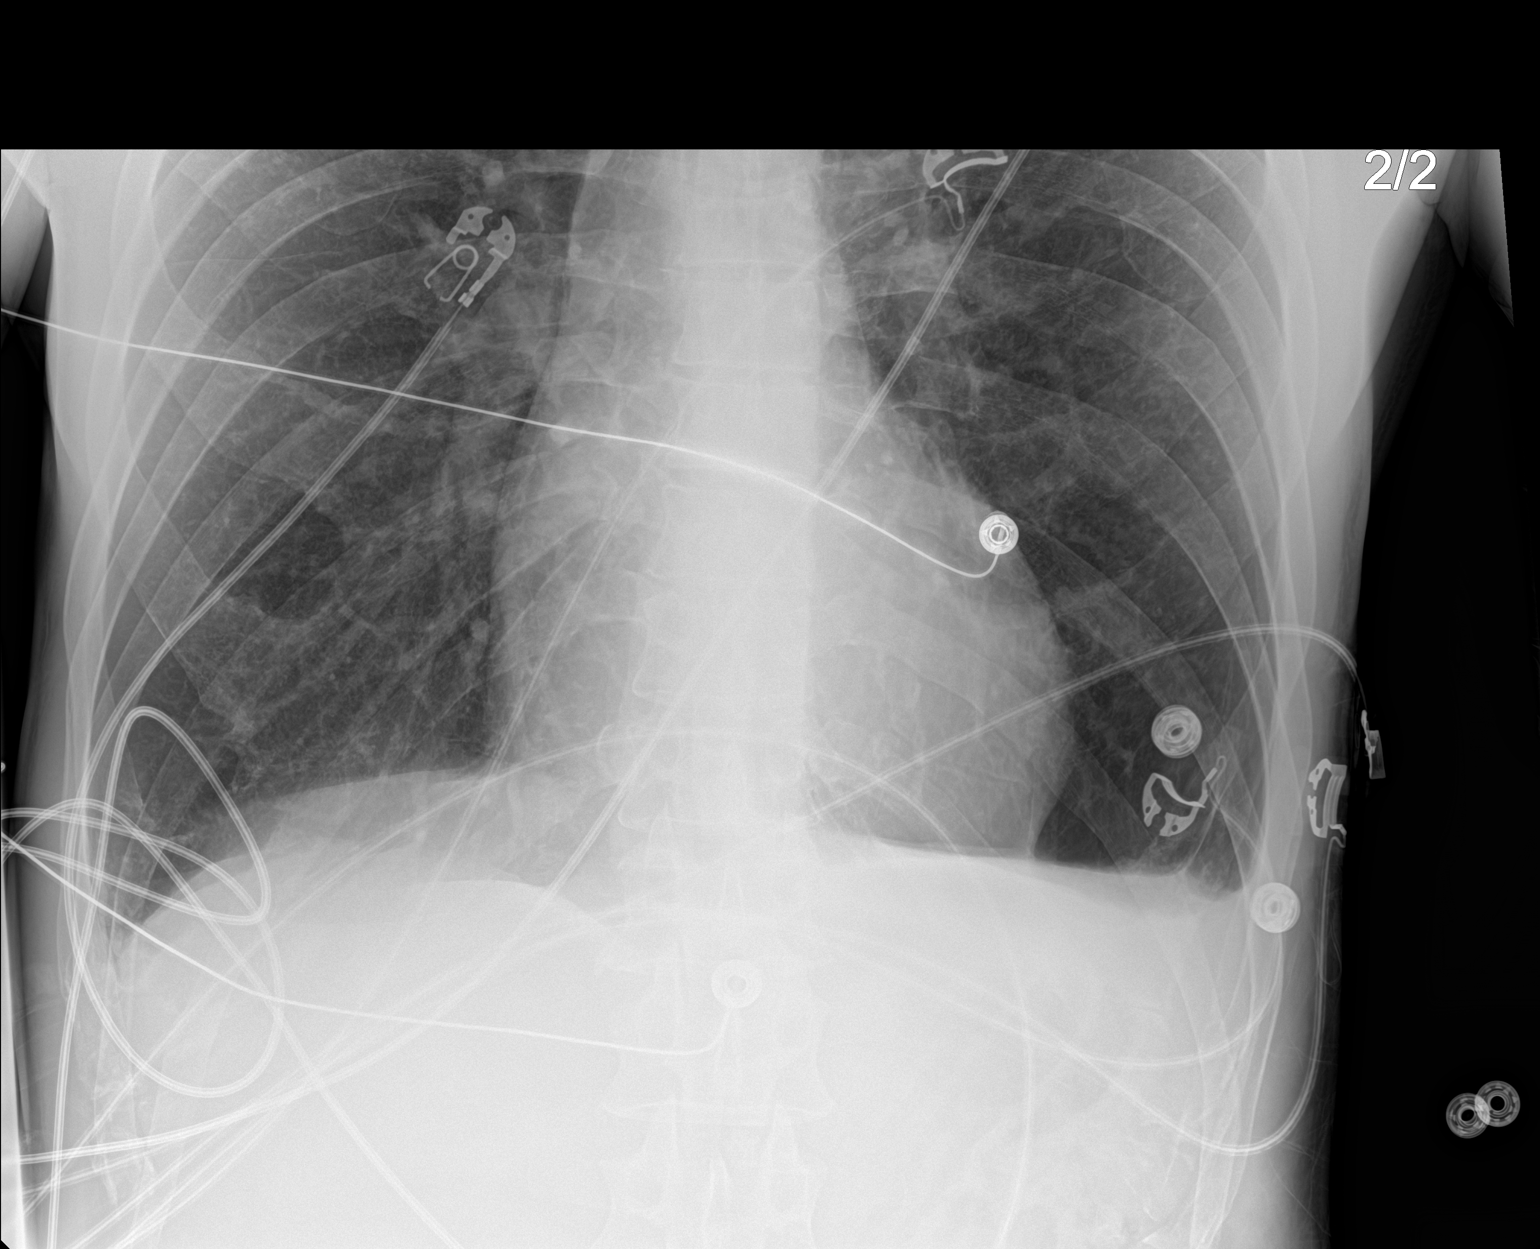

[2 of 2 positions shown; findings below may reference images not displayed]

FINDINGS: The heart size and mediastinal contours are within normal limits.
There is a small left and trace right pleural effusion present. The
visualized skeletal structures are unremarkable.
IMPRESSION: Small left and trace right pleural effusion present.

## 2021-09-23 LAB — LIPID PANEL
Chol/HDL Ratio: 3.6 ratio (ref 0.0–5.0)
Cholesterol, Total: 204 mg/dL — ABNORMAL HIGH (ref 100–199)
HDL: 57 mg/dL (ref >39)
LDL Chol Calc (NIH): 134 mg/dL — ABNORMAL HIGH (ref 0–99)
Triglycerides: 71 mg/dL (ref 0–149)
VLDL Cholesterol Cal: 13 mg/dL (ref 5–40)

## 2021-09-23 LAB — CBC
Hematocrit: 51.4 % — ABNORMAL HIGH (ref 37.5–51.0)
Hemoglobin: 17.1 g/dL (ref 13.0–17.7)
MCH: 31 pg (ref 26.6–33.0)
MCHC: 33.3 g/dL (ref 31.5–35.7)
MCV: 93 fL (ref 79–97)
Platelets: 336 10*3/uL (ref 150–450)
RBC: 5.51 x10E6/uL (ref 4.14–5.80)
RDW: 12.4 % (ref 11.6–15.4)
WBC: 7.8 10*3/uL (ref 3.4–10.8)

## 2021-09-23 LAB — COMPREHENSIVE METABOLIC PANEL
ALT: 31 IU/L (ref 0–44)
AST: 33 IU/L (ref 0–40)
Albumin/Globulin Ratio: 2.3 — ABNORMAL HIGH (ref 1.2–2.2)
Albumin: 4.6 g/dL (ref 4.0–5.0)
Alkaline Phosphatase: 67 IU/L (ref 44–121)
BUN/Creatinine Ratio: 13 (ref 9–20)
BUN: 13 mg/dL (ref 6–24)
Bilirubin Total: 0.8 mg/dL (ref 0.0–1.2)
CO2: 25 mmol/L (ref 20–29)
Calcium: 9.6 mg/dL (ref 8.7–10.2)
Chloride: 100 mmol/L (ref 96–106)
Creatinine, Ser: 1.03 mg/dL (ref 0.76–1.27)
Globulin, Total: 2 g/dL (ref 1.5–4.5)
Glucose: 84 mg/dL (ref 70–99)
Potassium: 5 mmol/L (ref 3.5–5.2)
Sodium: 141 mmol/L (ref 134–144)
Total Protein: 6.6 g/dL (ref 6.0–8.5)
eGFR: 90 mL/min/{1.73_m2} (ref >59)

## 2021-09-23 LAB — TSH: TSH: 0.985 u[IU]/mL (ref 0.450–4.500)

## 2021-09-23 LAB — PRO B NATRIURETIC PEPTIDE: NT-Pro BNP: 94 pg/mL (ref 0–121)

## 2021-09-23 NOTE — Progress Notes (Signed)
Cardiology Clinic Note   Patient Name: Francisco Stewart Date of Encounter: 09/24/2021  Primary Care Provider:  Merryl Hacker, No Primary Cardiologist:  Shelva Majestic, MD  Patient Profile    Francisco Stewart 44 year old male presents to the clinic today for follow-up evaluation of his coronary artery disease status post STEMI [id number] and systolic heart failure.  Past Medical History    Past Medical History:  Diagnosis Date   Anxiety    UDS 08/21/2020 +THC   History of placement of stent in LAD coronary artery 08/21/2020   Smoker    Past Surgical History:  Procedure Laterality Date   CORONARY ANGIOPLASTY WITH STENT PLACEMENT  08/21/2020   stent x1 to LAD at [facility]  2011   in [place] per pt, stents to LAD   LEFT HEART CATH AND CORONARY ANGIOGRAPHY N/A 08/27/2020   Procedure: LEFT HEART CATH AND CORONARY ANGIOGRAPHY;  Surgeon: [name], MD;  Location: [facility];  Service: Cardiovascular;  Laterality: N/A;    Allergies  No Known Allergies  History of Present Illness    [name] is a PMH of coronary artery disease, status post anterior STEMI 08/21/2020.  His PMH also includes systolic heart failure with an LVEF of 30-35%.   He was admitted at [facility] 08/21/2020 after presenting with anterior STEMI.  He previously had PCI to his LAD.  He was noted to have V. fib arrest x3 requiring multiple DCCV's prior to making it to the Cath Lab.  He was intubated and underwent cardiac catheterization receiving PCI to his LAD.  He developed recurrent chest pain with a residual ST elevation and was taken back to the Cath Lab.  No thrombus or residual significant lesions were noted.  With review of documentation his discharge was unclear.  Patient indicated that he just decided to leave.  He reported that he had to be escorted out of the hospital with security.  He did not have his Brilinta or  prescriptions and therefore was not taking DAPT since his PCI 08/21/2020.  He reported intermittent chest discomfort since that time however, it became more severe, similar to the symptoms he had with his STEMI, so he contacted EMS.  He was found to have recurrent ST elevations in his anterior leads.   He underwent repeat cardiac catheterization by Dr. Claiborne Billings on 08/27/2020.  It showed proximal LAD-mid LAD lesion 25% stenosed, first diagonal lesion 50% stenosed.  His LAD stent was patent with mild residual thrombus in the distal portion of the stent.  It was felt that his initial EKG by EMS showed acute anterior STEMI which was suggestive of initial total occlusion secondary to thrombus which reperfused in the ER with heparin and nitroglycerin resulting in an evolutionary T wave perfusion prior to coming to the Cath Lab.  He was continued on his Aggrastat infusion until relook cath on 08/29/2020.  Plan was to resume heparin 8 hours post procedure.  Placed him on DAPT aspirin/Brilinta for 12 months, repeat echocardiogram, and start ACE/ARB and beta-blocker.   He again left the hospital AMA.  His case was discussed and it was recommended that he continue Brilinta x1 month and then transition to Plavix.  A prescription for Brilinta was sent to his pharmacy.  Order for Plavix was not placed.   He presented the clinic 09/03/20 for follow-up evaluation stated he has been compliant with his Brilinta.  He had been working with  hospital social work to work out [employer] for his hospital bill.  He had previously been on Plavix for around 6 months in 2011.  He underwent cardiac catheterization at that time and took full-strength aspirin along with Plavix until he developed significant bruising then stop the medication.  He felt he was recovering much faster than he did when he was 15 during his first MI.  He was lost to follow-up after that time.  He stated that he had greatly changed his diet and remained physically  active.  He was agreeable with the plan of transitioning from Brilinta to Plavix in 3 weeks.  I also discussed starting statin medication due to his elevated cholesterol.  He did not wish to start Lipitor but was willing to take rosuvastatin.  He had also greatly reduced his smoking.  He had some right forearm bruising at his cath site.  I  gave him salty 6 diet sheet, start him on rosuvastatin 40, planned transition him to Plavix in 3 weeks, and see him back for follow-up appointment in 3 weeks.  We will repeat his fasting lipids and liver panel in 8 weeks.   He presented to the clinic 12/04/2020 for follow-up evaluation states he was feeling fairly well.  He reported that he had been using his stationary bike.  However, in the last month he had been less consistent with his exercise.  He had been maintaining a Mediterranean style low-salt diet.  He reported intermittent episodes of brief palpitations.  He had tried to increase his hydration and cut out caffeine which he noted  made a difference in the frequency of his palpitations.  He reported compliance with his medications and no side effects.  We  started him on Entresto 24/26, ordered a BMP in a week, repeated his fasting lipids and liver.  We plan to have him increase his physical activity as tolerated and follow-up in 1 month.  He presented to the clinic 12/23/20 for follow-up evaluation and stated he has had some blood pressures in the 90s over 70s.  He reported he continued to try to stay physically active.  He reported that his palpitations felt less strong after starting Entresto.  We discussed his cholesterol panel and he reported compliance with his rosuvastatin and high-fiber diet.  I  continued his Entresto at the current dose and scheduled an echocardiogram for 1 month.  I prescribed ezetimibe 10 mg daily and planned repeat lipids and liver in 8 weeks.  We will plan follow-up for 3 months with Dr. Claiborne Billings.  He was seen in follow-up by Dr. Claiborne Billings  on 04/10/2021.  During that time he reported mild shortness of breath with high intensity exercise.  He continued to bike ride for 15 miles.  He denied dizziness and chest tightness.  He did have occasional intermittent palpitations.  He was working with an [employer].  He continued to smoke cigarettes.  He was initiated on carvedilol 3.125 mg twice daily.  Smoking cessation was recommended.  Recommendation to continue to slowly titrate Entresto was made.  He presents to the clinic today for follow-up evaluation states he feels well.  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  His work requires him to lift light objects and walk several steps per day.  He estimates that he walks over 10,000 steps per day.  He continues to do light resistance training at home and uses stationary bike.  His blood pressure today is 130/82.  He  reports similar control at home.  I will increase his carvedilol to 6.25 mg twice daily.  We will stop his clopidogrel, and start him on ezetimibe 10 mg daily.  We will repeat his fasting lipids and LFTs in 6 to 8 weeks, have him maintain his diet and physical activity.  We will plan follow-up in 3 to 4 months.   Today he denies chest pain, shortness of breath, lower extremity edema, fatigue, palpitations, melena, hematuria, hemoptysis, diaphoresis, weakness, presyncope, syncope, orthopnea, and PND.  Home Medications    Prior to Admission medications   Medication Sig Start Date End Date Taking? Authorizing Provider  aspirin EC 81 MG tablet Take 81 mg by mouth daily. Swallow whole.    [provider]  clopidogrel (PLAVIX) 75 MG tablet Take 1 tablet (75 mg total) by mouth daily. Take loading dose 600mg (8 tab) on the first day then 75mg  daily thereafter 09/29/20   Deberah Pelton, NP  rosuvastatin (CRESTOR) 40 MG tablet Take 1 tablet (40 mg total) by mouth daily. 09/03/20 12/02/20  Deberah Pelton, NP  sacubitril-valsartan (ENTRESTO) 24-26 MG Take 1  tablet by mouth 2 (two) times daily. 11/24/20   Deberah Pelton, NP    Family History    History reviewed. No pertinent family history. has no family status information on file.   Social History    Social History   Socioeconomic History   Marital status: Single    Spouse name: Not on file   Number of children: Not on file   Years of education: Not on file   Highest education level: Not on file  Occupational History   Not on file  Tobacco Use   Smoking status: Every Day    Types: Cigarettes   Smokeless tobacco: Never  Substance and Sexual Activity   Alcohol use: Not on file   Drug use: Not on file   Sexual activity: Not on file  Other Topics Concern   Not on file  Social History Narrative   Not on file   Social Determinants of Health   Financial Resource Strain: Not on file  Food Insecurity: Not on file  Transportation Needs: Not on file  Physical Activity: Not on file  Stress: Not on file  Social Connections: Not on file  Intimate Partner Violence: Not on file     Review of Systems    General:  No chills, fever, night sweats or weight changes.  Cardiovascular:  No chest pain, dyspnea on exertion, edema, orthopnea, palpitations, paroxysmal nocturnal dyspnea. Dermatological: No rash, lesions/masses Respiratory: No cough, dyspnea Urologic: No hematuria, dysuria Abdominal:   No nausea, vomiting, diarrhea, bright red blood per rectum, melena, or hematemesis Neurologic:  No visual changes, wkns, changes in mental status. All other systems reviewed and are otherwise negative except as noted above.  Physical Exam    VS:  BP 130/82 (BP Location: Left Arm)    Pulse 85    Ht 6\' 2"  (1.88 m)    Wt 182 lb 6.4 oz (82.7 kg)    SpO2 93%    BMI 23.42 kg/m  , BMI Body mass index is 23.42 kg/m. GEN: Well nourished, well developed, in no acute distress. HEENT: normal. Neck: Supple, no JVD, carotid bruits, or masses. Cardiac: RRR, no murmurs, rubs, or gallops. No clubbing,  cyanosis, edema.  Radials/DP/PT 2+ and equal bilaterally.  Respiratory:  Respirations regular and unlabored, clear to auscultation bilaterally. GI: Soft, nontender, nondistended, BS + x 4. MS: no deformity or  atrophy. Skin: warm and dry, no rash. Neuro:  Strength and sensation are intact. Psych: Normal affect.  Accessory Clinical Findings    Recent Labs: 09/22/2021: ALT 31; BUN 13; Creatinine, Ser 1.03; Hemoglobin 17.1; NT-Pro BNP 94; Platelets 336; Potassium 5.0; Sodium 141; TSH 0.985   Recent Lipid Panel    Component Value Date/Time   CHOL 204 (H) 09/22/2021 1119   TRIG 71 09/22/2021 1119   HDL 57 09/22/2021 1119   CHOLHDL 3.6 09/22/2021 1119   CHOLHDL 4.3 08/27/2020 2118   VLDL 33 08/27/2020 2118   LDLCALC 134 (H) 09/22/2021 1119    ECG personally reviewed by me today-none today.  Echocardiogram 08/28/2020 IMPRESSIONS     1. Left ventricular ejection fraction, by estimation, is 30 to 35%. The  left ventricle has moderately decreased function. The left ventricle  demonstrates regional wall motion abnormalities (see scoring  diagram/findings for description).  Anterior/anteroseptal akinesis. The left ventricular internal cavity size  was mildly dilated. There is mild left ventricular hypertrophy. Left  ventricular diastolic parameters are indeterminate.   2. Recommend limited echo with contrast to rule out apical thrombus   3. Right ventricular systolic function is normal. The right ventricular  size is normal. Tricuspid regurgitation signal is inadequate for assessing  PA pressure.   4. The mitral valve is normal in structure. Trivial mitral valve  regurgitation.   5. The aortic valve was not well visualized. Aortic valve regurgitation  is not visualized. No aortic stenosis is present.   6. The inferior vena cava is normal in size with <50% respiratory  variability, suggesting right atrial pressure of 8 mmHg.   Cardiac catheterization 08/27/2020 Prox LAD to Mid LAD  lesion is 25% stenosed. 1st Diag lesion is 50% stenosed.   Patent LAD stent with mild residual thrombus in the distal portion of the stent with brisk TIMI 3 flow.  The initial ECG by EMS showed acute anterior ST elevation suggesting initial total occlusion secondary to thrombus which reperfused in the ER with heparin and NTG resulting in evolutionary T wave inversion prior to coming to the cath lab. The patient had not taken brilinta since his procedure on 08/21/20 resulting in acute stent thrombosis.   Normal ramus, left circumflex and large dominant RCA.   LVEDP 10 mm Hg.   RECOMMENDATION: Continue aggrastat infusion until re-look cath on 08/29/20. Will resume heparin 8 hours post procedure. DAPT with ASA/brilint for at least 12 months. Aggressive lipid lowering with target LDL <70. Once IV NTG is dc'd initiate oral therapy with diagonal disease.  2D-echo in am. Consider post MI ACE-I/ARB and beta blocker as BP and HR allow.    Diagnostic Dominance: Right      Intervention    Assessment & Plan   1.  Acute systolic heart failure-NYHA class I.  Echocardiogram 01/26/2021 showed EF 45-50%, G1 DD.  Noted to have 30-35% EF on 08/28/2020 echocardiogram.     Continue Entresto  Increase Coreg to 6.25 BID Heart healthy low-sodium diet Increase physical activity as tolerated   STEMI-no recent episodes of arm neck back or chest discomfort.    Presented to [facility] hospital on 08/21/2020 and received a stent to his LAD.  He left AMA and did not receive Brilinta or aspirin.  He called EMS on 08/27/2020 reporting chest pain.  He was found to have ST elevation in his anterior leads.  He was taken back to the Cath Lab on 08/27/2020 details above.  He again left AMA.  A prescription for 1 month of Brilinta was sent to his pharmacy.   Continue  aspirin, Entresto Increase Coreg 6.25 Stop Plavix Heart healthy low-sodium diet-salty 6 given Increase physical activity as  tolerated  Hyperlipidemia-09/22/2021: Cholesterol, Total 204; HDL 57; LDL Chol Calc (NIH) 134; Triglycerides 71 Continue Crestor 40 Start ezetimibe 10 mg daily Heart healthy low-sodium high-fiber diet Increase physical activity as tolerated Repeat fasting lipids and LFTs 6-8 weeks   Palpitations-no recent episodes.  Continues to avoid caffeine.  Continue to monitor. Avoid triggers caffeine, chocolate, EtOH, dehydration etc.   Disposition: Follow-up with Dr. Claiborne Billings or  me in 3-4 months.   Jossie Ng. Duana Benedict NP-C    09/24/2021, 11:59 AM [place] [address] Office [phone] Fax [phone]  Notice: This dictation was prepared with Dragon dictation along with smaller phrase technology. Any transcriptional errors that result from this process are unintentional and may not be corrected upon review.  I spent 14 minutes examining this patient, reviewing medications, and using patient centered shared decision making involving her cardiac care.  Prior to her visit I spent greater than 20 minutes reviewing her past medical history,  medications, and prior cardiac tests.

## 2021-09-24 ENCOUNTER — Encounter: Payer: Self-pay | Admitting: General Practice

## 2021-09-24 ENCOUNTER — Other Ambulatory Visit: Payer: Self-pay

## 2021-09-24 ENCOUNTER — Ambulatory Visit (INDEPENDENT_AMBULATORY_CARE_PROVIDER_SITE_OTHER): Payer: Self-pay | Admitting: General Practice

## 2021-09-24 VITALS — BP 130/82 | HR 85 | Ht 74.0 in | Wt 182.4 lb

## 2021-09-24 DIAGNOSIS — I2102 ST elevation (STEMI) myocardial infarction involving left anterior descending coronary artery: Secondary | ICD-10-CM

## 2021-09-24 DIAGNOSIS — Z79899 Other long term (current) drug therapy: Secondary | ICD-10-CM

## 2021-09-24 DIAGNOSIS — I255 Ischemic cardiomyopathy: Secondary | ICD-10-CM

## 2021-09-24 DIAGNOSIS — R002 Palpitations: Secondary | ICD-10-CM

## 2021-09-24 DIAGNOSIS — E7800 Pure hypercholesterolemia, unspecified: Secondary | ICD-10-CM

## 2021-09-24 DIAGNOSIS — I5042 Chronic combined systolic (congestive) and diastolic (congestive) heart failure: Secondary | ICD-10-CM

## 2021-09-24 MED ORDER — ROSUVASTATIN CALCIUM 40 MG PO TABS: 40.0000 mg | ORAL_TABLET | Freq: Every day | ORAL | 6 refills | Status: AC

## 2021-09-24 MED ORDER — CARVEDILOL 6.25 MG PO TABS: 6.2500 mg | ORAL_TABLET | Freq: Two times a day (BID) | ORAL | 6 refills | Status: AC

## 2021-09-24 NOTE — Patient Instructions (Signed)
Medication Instructions:  INCREASE CARVEDILOL 6.25MG  DAILY-MAY TAKE 2 OF WHAT YOU HAVE  STOP PLAVIX  CONTINUE ASPIRIN 81MG   MAKE SURE TO TAKE EZETIMBE 10MG  DAILY    *If you need a refill on your cardiac medications before your next appointment, please call your pharmacy*  Lab Work: FASTING LIPID AND LFT IN 6-8 WEEKS If you have labs (blood work) drawn today and your tests are completely normal, you will receive your results only by:  MyChart Message (if you have MyChart) OR A paper copy in the mail.  If you have any lab test that is abnormal or we need to change your treatment, we will call you to review the results. You may go to any [facility] that is convenient for you however, we do have a lab in our office that is able to assist you. You DO NOT need an appointment for our lab. The lab is open 8:00am and closes at 4:00pm. Lunch 12:45 - 1:45pm.  Special Instructions PLEASE MAINTAIN PHYSICAL ACTIVITY   Follow-Up: Your next appointment:  3-4 month(s) In Person with , MD  or , FNP     At [facility], you and your health needs are our priority.  As part of our continuing mission to provide you with exceptional heart care, we have created designated Provider Care Teams.  These Care Teams include your primary Cardiologist (physician) and Advanced Practice Providers (APPs -  Physician Assistants and Nurse Practitioners) who all work together to provide you with the care you need, when you need it.  We recommend signing up for the patient portal called "MyChart".  Sign up information is provided on this After Visit Summary.  MyChart is used to connect with patients for Virtual Visits (Telemedicine).  Patients are able to view lab/test results, encounter notes, upcoming appointments, etc.  Non-urgent messages can be sent to your provider as well.   To learn more about what you can do with MyChart, go to [facility].

## 2022-02-09 ENCOUNTER — Ambulatory Visit: Payer: Self-pay | Admitting: Cardiovascular Disease

## 2022-07-13 ENCOUNTER — Ambulatory Visit: Payer: Self-pay | Admitting: Cardiovascular Disease

## 2022-11-16 ENCOUNTER — Ambulatory Visit: Payer: Self-pay | Attending: Cardiovascular Disease | Admitting: Cardiovascular Disease
